# Patient Record
Sex: Male | Born: 1960 | Race: White | Hispanic: No | Marital: Single | Smoking: Current every day smoker
Health system: Southern US, Community
[De-identification: ages and names within clinical notes are randomized; demographics above are authoritative.]

## PROBLEM LIST (undated history)

## (undated) DIAGNOSIS — M5126 Other intervertebral disc displacement, lumbar region: Secondary | ICD-10-CM

## (undated) HISTORY — PX: COLONOSCOPY: SHX174

---

## 2015-11-12 ENCOUNTER — Ambulatory Visit (INDEPENDENT_AMBULATORY_CARE_PROVIDER_SITE_OTHER): Payer: BLUE CROSS/BLUE SHIELD | Admitting: Podiatry

## 2015-11-12 ENCOUNTER — Ambulatory Visit (INDEPENDENT_AMBULATORY_CARE_PROVIDER_SITE_OTHER): Payer: BLUE CROSS/BLUE SHIELD

## 2015-11-12 ENCOUNTER — Encounter: Payer: Self-pay | Admitting: Podiatry

## 2015-11-12 VITALS — BP 143/89 | HR 90 | Resp 14

## 2015-11-12 DIAGNOSIS — G5761 Lesion of plantar nerve, right lower limb: Secondary | ICD-10-CM

## 2015-11-12 DIAGNOSIS — R52 Pain, unspecified: Secondary | ICD-10-CM

## 2015-11-12 DIAGNOSIS — G5781 Other specified mononeuropathies of right lower limb: Secondary | ICD-10-CM

## 2015-11-12 NOTE — Progress Notes (Addendum)
   Subjective:    Patient ID: [name], male    DOB: 01/14/1961, 55 y.o.   MRN: [id number]  HPI this patient presents to the office with chief complaint of a painful right foot.  He says this pain has been present for about 4 weeks. He denies any trauma or injury to the foot. He does admit being active and walking on concrete. He also says there is burning noted in the area of the pain of the right foot. He denies any redness or swelling to his right foot. He presents the office for an evaluation and treatment    Review of Systems  All other systems reviewed and are negative.      Objective:   Physical Exam GENERAL APPEARANCE: Alert, conversant. Appropriately groomed. No acute distress.  VASCULAR: Pedal pulses are  palpable at  [facility] and [facility] bilateral.  Capillary refill time is immediate to all digits,  Normal temperature gradient.  Digital hair growth is present bilateral  NEUROLOGIC: sensation is normal to 5.07 monofilament at 5/5 sites bilateral.  Light touch is intact bilateral, Muscle strength normal.  MUSCULOSKELETAL: acceptable muscle strength, tone and stability bilateral.  Intrinsic muscluature intact bilateral.  Rectus appearance of foot and digits noted bilateral. Palpable pain third interspace right foot.  Rigid hammer toe second right foot.  DERMATOLOGIC: skin color, texture, and turgor are within normal limits.  No preulcerative lesions or ulcers  are seen, no interdigital maceration noted.  No open lesions present.  Digital nails are asymptomatic. No drainage noted.         Assessment & Plan:  Neuroma right foot.    IE  Xray reveals no pathology except hammer toe.  Injection therapy right foot.  Powerstep was recommended.  RTC 2 weeks.   Helane GuntherGregory Mayer DPM

## 2015-11-26 ENCOUNTER — Ambulatory Visit (INDEPENDENT_AMBULATORY_CARE_PROVIDER_SITE_OTHER): Payer: BLUE CROSS/BLUE SHIELD | Admitting: Podiatry

## 2015-11-26 ENCOUNTER — Encounter: Payer: Self-pay | Admitting: Podiatry

## 2015-11-26 VITALS — BP 138/88 | HR 85 | Resp 14

## 2015-11-26 DIAGNOSIS — G5761 Lesion of plantar nerve, right lower limb: Secondary | ICD-10-CM | POA: Diagnosis not present

## 2015-11-26 DIAGNOSIS — G5781 Other specified mononeuropathies of right lower limb: Secondary | ICD-10-CM

## 2015-11-26 NOTE — Progress Notes (Signed)
Subjective:     Patient ID: [name], male   DOB: 08/24/1960, 55 y.o.   MRN: [id number]  HPI this patient returns to the office follow-up for diagnosis neuroma third interspace left foot. He was treated with injection therapy and says that he is over 50% improved. He says he no longer feels a hard marble under his right foot. He says there is still pain and discomfort at the site. He presents to the office stating that the pure strides are helping but he he does significant walking on concrete at work. He presents the office for continued evaluation and treatment of this diagnosed neuroma   Review of Systems     Objective:   Physical Exam GENERAL APPEARANCE: Alert, conversant. Appropriately groomed. No acute distress.  VASCULAR: Pedal [facility]  [facility].  Capillary refill time is immediate to all digits,  Normal temperature gradient.  Digital hair growth is present bilateral  NEUROLOGIC: sensation is normal to 5.07 monofilament at 5/5 sites bilateral.  Light touch is intact bilateral, Muscle strength normal.  MUSCULOSKELETAL: acceptable muscle strength, tone and stability bilateral.  Intrinsic muscluature intact bilateral.  Rectus appearance of foot and digits noted bilateral. Palpable pain third interspace right foot.  Rigid hammer toe right foot.  DERMATOLOGIC: skin color, texture, and turgor are within normal limits.  No preulcerative lesions or ulcers  are seen, no interdigital maceration noted.  No open lesions present.  Digital nails are asymptomatic. No drainage noted.      Assessment:     Neuroma third interspace right foot.     Plan:     ROV  Injection therapy right foot.  Discussed next step in treatment of his neuroma. RTC prn   Helane GuntherGregory Virgel Haro DPM

## 2016-09-12 ENCOUNTER — Ambulatory Visit: Payer: Self-pay | Admitting: Orthopedic Surgery

## 2016-09-12 NOTE — Progress Notes (Signed)
Please place orders in EPIC as patient is being scheduled for a Pre-op appointment! Thank you! 

## 2016-09-12 NOTE — H&P (Signed)
[name] is an 55 y.o. male.   Chief Complaint: Left leg pain, numbness and weakness HPI: The patient is a 55 year old male who presents with back pain. The patient is here today for an IME with take over of care. The patient reports low back symptoms which began 12 week(s) ago following a specific injury. The injury occurred 06/04/2016 at work due to lifting (40-60 lbs) and bending. Symptoms are reported to be located in the low back and Symptoms include pain. The pain radiates to the left buttock, left posterior thigh, left lower leg and left foot. Symptoms are exacerbated by standing (for extended periods of time) and sitting. Current treatment includes nonsteroidal anti-inflammatory drugs. Prior to being seen today the patient was previously evaluated in the ER and at [facility]. Past evaluation has included x-ray of the lumbar spine and MRI of the lumbar spine. Past treatment has included nonsteroidal anti-inflammatory drugs, non-opioid analgesics, muscle relaxants and corticosteroids. The patient states that this is a Worker's Compensation case. Note for "Back pain": The patient is currently working with light duty restrictions.  HISTORY This is a very pleasant gentleman who presents here for an independent medical exam for a work related injury. The patient reports that on 06/04/2016 while he was working and doing well he was lifting an object maybe 40 pounds bending and twisting at that time. He had acute low back pain and that progressed into the left lower extremity radicular pain. Back pain that night and then the next morning got out of bed he had severe pain into the leg. This prompted him to present to the emergency room. This was at [facility] and that was in [place], [place]. He was found and diagnosed with acute low back pain and left lower extremity pain. There was seen by Dr. Vreeland.  He was will placed on ibuprofen, prednisone and cyclobenzaprine. He felty had  some mild help from that. He was subsequently seen at [facility] on 07/14/2016. Prior to that he was seen by Dr. James Kramer, I do not have that medical record. He apparently ordered an MRI. This was performed on 06/30/2016. This was a [facility], this where apparently was noted to have a large disc herniation at 3-4 compressing the 4 root with associated severe stenosis at 3-4 through 4-5. He was at that time taking tramadol, gabapentin. He reported a history of a neuropathy in the right lower extremity, not in the left lower extremity. On December 18, he was subsequently seen by a Dr. Lemons and had a consultation. He was found to have severe stenosis, disc herniation on the left. He was left groin and thigh pain as well. Trouble walking. He discussed a decompression from L2 to S1. Weakness and ankle dorsiflexion on the left compared to the right. He was also counseled on tobacco cessation. Started on methocarbamol, tramadol. X-rays indicated no evidence of a spondylolisthesis.  REVIEW OF SYSTEMS Negative for fevers, chest pain, shortness of breath, unexplained recent weight loss, loss of bowel or bladder function, burning with urination, joint swelling, rashes, weakness or numbness, difficulty with balance, easy bruising, excessive thirst or frequent urination. He presents here for a second opinion concerning his current situation. He is taking the gabapentin one to two twice a day.  In terms of his neuropathy on the right he has not a diabetic. There is no specific diagnosis for the neuropathy, but was taken gabapentin and it was helping him.  In terms of   the left leg it is worse when he stands or sits for long periods of time. When he bends or lifts.  He has not had a problem with neck and upper extremity pain, problems with his bladder, ataxia, fevers or chills.  The patient pain drawing is organic. He did not rate his pain. Visual analog scale indicated mid range at this  point. He reports left greater than right pain.  He also reports continuing to work. He has requested perhaps out of work. He lives in [place], [place], perhaps closer to [place].  Past Medical History Bleeding disorder  Hepatitis C  Hypercholesterolemia  Peripheral Neuropathy   Allergies No Known Drug Allergies [08/27/2016]:  Family History Heart Disease  Maternal Grandfather.  Social History  Children  1 Current drinker  08/27/2016: Currently drinks beer and hard liquor 5-7 times per week Current work status  working full time Exercise  Exercises daily; does other Living situation  live alone Marital status  single No history of drug/alcohol rehab  Not under pain contract  Number of flights of stairs before winded  greater than 5 Tobacco / smoke exposure  08/27/2016: yes outdoors only Tobacco use  Current every day smoker. 08/27/2016: smoke(d) 1/2 pack(s) per day  Medication History Atorvastatin Calcium (20MG Tablet, Oral) Active.  Past Surgical History Arthroscopy of Knee  bilateral Leg Circulation Surgery  right Tonsillectomy   Review of Systems  Constitutional: Negative.   HENT: Negative.   Eyes: Negative.   Respiratory: Negative.   Cardiovascular: Negative.   Gastrointestinal: Negative.   Genitourinary: Negative.   Musculoskeletal: Positive for back pain.  Skin: Negative.   Neurological: Positive for sensory change and focal weakness.  Endo/Heme/Allergies: Negative.   Psychiatric/Behavioral: Negative.    Vitals 08/27/2016 3:48 PM Weight: 185 lb Height: 71in Body Surface Area: 2.04 m Body Mass Index: 25.8 kg/m  BP: 123/77 (Sitting, Right Arm, Standard)  Physical Exam  Constitutional: He is oriented to person, place, and time. He appears well-developed and well-nourished.  HENT:  Head: Normocephalic.  Eyes: Pupils are equal, round, and reactive to light.  Neck: Normal range of motion.  Cardiovascular: Normal rate.    Respiratory: Effort normal.  GI: Soft.  Musculoskeletal:  On exam, he is a healthy male in mild to moderate distress, mood and affect, probably with sliding antalgic gait. He has tender lumbosacral junction, left PSIS. He has pain with extension and flexion of lumbar spine both limited. Straight leg raises buttock, thigh and calf pain on the left, negative on the right. He has bilateral EHL weakness. He does have some 5-/5 dorsiflexion of tibialis anterior on the left, he has 5-/5 quadriceps strength on left compared to the right. He is hyperreflexic, sensory exam in L5 dermatome bilaterally. Lumbar spine exam reveals no evidence of soft tissue swelling, deformity or skin ecchymosis. No flank pain with percussion. The abdomen is soft and nontender. Nontender over the trochanters. No cellulitis or lymphadenopathy.  Good range of motion of the lumbar spine without associated pain. There is no Babinski or clonus. Patient has good distal pulses. No DVT. No pain and normal range of motion without instability of the hips, knees and ankles.  Cervical spine, he has painless range of motion. He has some pain with end forward flexion and extension but no radicular pain. Inspection of the cervical spine reveals a normal lordosis without evidence of paraspinous spasms or soft tissue swelling. Nontender to palpation. Full extension, full left and right lateral rotation. Extension combined with lateral   flexion does not reproduce pain. Negative impingement sign, negative secondary impingement sign of the shoulders. Negative Tinel's median and ulnar nerves at the elbow. Negative carpal compression test at the wrist. Motor of the upper extremities is 5/5 including biceps, triceps, brachioradialis, wrist flexion, wrist extension, finger flexion, finger extension. Reflexes are normoreflexic. Sensory exam is intact to light touch. There is no Hoffmann sign. Nontender over the thoracic spine.  Neurological: He is alert and  oriented to person, place, and time.  Skin: Skin is warm and dry.    Imaging AP, lateral, flexion, extension radiographs of lumbar spine demonstrates a mild lumbar scoliosis, degenerative. He has mild osteoarthrosis. He has moderate osteoarthrosis of the left hip, mild on the right with a Cam impingement deformity. He has cystic changes in the femoral heads bilaterally. Flexion extension radiographs demonstrate multilevel disc degeneration, severe at 5-1 with a vacuum phenomena and neuroforaminal stenosis of L5. He has severe disc degeneration at 4-5 as well. No instability in flexion extension. He has moderate degenerative changes at 3-4 and at 2-3.  MRI December 4 demonstrates severe multifactorial stenosis at 3-4 and at 4-5. The acute disc herniation is noted at 3-4 extending caudad paracentral to the left.  L5-S1, he has facet arthropathy and he has some lateral recess stenosis at 5-1.  Assessment/Plan 1. L4 radiculopathy secondary to acute disc herniation at 3-4. 2. Underlying severe congenital and acquired spinal stenosis at 3-4 and at 4-5. 3. Multilevel disc degeneration, severe at 4-5 and at 5-1, moderate 3-4 and at 2-3 without instability on flexion, extension. 4. History of diagnosed peripheral neuropathy with likely chronic radiculopathy secondary to his spinal stenosis.  PLAN, RECOMMENDATION AND DISCUSSION Extensive discussion concerning his current pathology, relevant anatomy and treatment options. I do feel given his mechanism of injury as described it is sufficient to produce an acute disc herniation, which is noted by his symptomatology as well as his MRI given the disc herniation at 3-4. He does have underlying preexisting spinal stenosis at 3-4 and at 4-5 and is most likely the cause of his diagnosed chronic neuropathy on the right given his severe stenosis noted at 4-5.  I do agree that he will likely require a decompression to decompress at 3-4 nerve root as he has a paucity of  capacity in the spinal canal to accommodate an acute disc herniation of this size and given his minor neurologic deficit I do not feel that epidural steroid injection would be particularly efficacious at this point. I do feel, however, at least an urgent evaluation by a Mckenzie based therapist would be appropriate to determine whether he has any positional based reduction in his radiculopathy or centralization. I do, however, agree that given the mass effect of the disc herniation with his concomitant spinal stenosis it would require surgical decompression to gain access to the disc herniation at 3-4. It will require a decompression most likely at 3-4 removing neural arch of 3 and 4 and decompressing 3-4, 4-5 extending possibly into 2-3. He does have some mild stenosis at 5-1 and may require a hemidecompression L5-S1 on the left. He does have some evidence of an S1 radiculopathy, though it is most likely secondary to compression of the thecal sac at 4-5.  In the interim, we discussed activity modification, strategy to avoid reinjury, he is to avoid extention, favor flexion, I would recommend no prolonged standing or sitting over 30 minutes of time, no repetitive bending. No lifting over 10 pounds.  I do recommend analgesics. Gabapentin   has been particularly beneficial to him in the past and recommend 300 mg titrating up to three tablets three times a day for effect, in the interim an underlying anti-inflammatory medication with activity modification.  We discussed his pathology, relevant anatomy, reviewed his x-rays, MRI, his previous symptoms of a neuropathy and its relationship to his underlying stenosis, the mechanism of injury and his treatment options. At this point, I do agree that tobacco cessation is prerequisite as well indicating deleterious side effects upon healing, epidural fibrosis and infection rates. Typically this would require a decompression overnight in the hospital, sutures out in two  weeks and re-engage in formal supervised physical therapy until six weeks, at which point he could engage in a work conditioning type program and maximum medical improvement three months status post. Again the acute injury is his disc herniation, which is also exacerbate his underlying spinal stenosis and to address the disc herniation it would require decompressing his underlying spinal stenosis. I do not feel that he would require instrumentation. There is no sign of instability or foraminal compression that significant to that degree. He has minor mechanical back pain, he understands.  He does have a component of osteoarthrosis in his hips. He does not have severe or significant groin pain. I have discussed that in terms of future observation, avoid pivoting and further imaging if he develops groin pain or pivoting pain within the left groin.  We discussed this in extensive detail. This is currently an independent medical exam only. If he is so chooses to initiate treatment here we will be happy to assist in any manner in his care. He will discuss this with his adjuster. I would again recommend at least a session or two of Mackenzie based therapy which most likely will require Williams flexion based program, his gabapentin and then a repeat examination to determine its efficacy. He does seem to be somewhat better since his initial presentation in terms of severity of the pain. However, given his ongoing radicular pain and his clinical exam, I favor at this point decompression.  I appreciate the kind referral and ability to evaluate Mr. Luellen.  Plan microlumbar decompression L3-4, L4-5  Anwen Cannedy M., PA-C for Dr. Beane 09/12/2016, 10:08 AM   

## 2016-09-12 NOTE — Patient Instructions (Signed)
Tressia MinersScott Gockley  09/12/2016   Your procedure is scheduled on: 09-18-16  [facility]  [facility] to 3rd floor to  Short Stay Center at 7AM.  Call this number if you have problems the morning of surgery [phone]   Remember: ONLY 1 PERSON MAY GO WITH YOU TO SHORT STAY TO GET  READY MORNING OF YOUR SURGERY.  Do not eat food or drink liquids :After Midnight.     Take these medicines the morning of surgery with A SIP OF WATER: tylenol as needed, gabapentin(Neurontin)                                You may not have any metal on your body including hair pins and              piercings  Do not wear jewelry, make-up, lotions, powders or perfumes, deodorant             Do not wear nail polish.  Do not shave  48 hours prior to surgery.              Men may shave face and neck.   Do not bring valuables to the hospital.  IS NOT             RESPONSIBLE   FOR VALUABLES.  Contacts, dentures or bridgework may not be worn into surgery.  Leave suitcase in the car. After surgery it may be brought to your room.              Please read over the following fact sheets you were given: _____________________________________________________________________             [facility] Pre[facility] Before surgery, you can play an important role.  Because skin is not sterile, your skin needs to be as free of germs as possible.  You can reduce the number of germs on your skin by washing with CHG (chlorahexidine gluconate) soap before surgery.  CHG is an antiseptic cleaner which kills germs and bonds with the skin to continue killing germs even after washing. Please DO NOT use if you have an allergy to CHG or antibacterial soaps.  If your skin becomes reddened/irritated stop using the CHG and inform your nurse when you arrive at Short Stay. Do not shave (including legs and underarms) for at least 48 hours prior to the first CHG shower.   You may shave your face/neck. Please follow these instructions carefully:  1.  Shower with CHG Soap the night before surgery and the  morning of Surgery.  2.  If you choose to wash your hair, wash your hair first as usual with your  normal  shampoo.  3.  After you shampoo, rinse your hair and body thoroughly to remove the  shampoo.                           4.  Use CHG as you would any other liquid soap.  You can apply chg directly  to the skin and wash                       Gently with a scrungie or clean washcloth.  5.  Apply the CHG Soap to your body  ONLY FROM THE NECK DOWN.   Do not use on face/ open                           Wound or open sores. Avoid contact with eyes, ears mouth and genitals (private parts).                       Wash face,  Genitals (private parts) with your normal soap.             6.  Wash thoroughly, paying special attention to the area where your surgery  will be performed.  7.  Thoroughly rinse your body with warm water from the neck down.  8.  DO NOT shower/wash with your normal soap after using and rinsing off  the CHG Soap.                9.  Pat yourself dry with a clean towel.            10.  Wear clean pajamas.            11.  Place clean sheets on your bed the night of your first shower and do not  sleep with pets. Day of Surgery : Do not apply any lotions/deodorants the morning of surgery.  Please wear clean clothes to the hospital/surgery center.  FAILURE TO FOLLOW THESE INSTRUCTIONS MAY RESULT IN THE CANCELLATION OF YOUR SURGERY PATIENT SIGNATURE_________________________________  NURSE SIGNATURE__________________________________  ________________________________________________________________________   Adam Phenix  An incentive spirometer is a tool that can help keep your lungs clear and active. This tool measures how well you are filling your lungs with each breath. Taking long deep breaths may help reverse or decrease the chance of  developing breathing (pulmonary) problems (especially infection) following:  A long period of time when you are unable to move or be active. BEFORE THE PROCEDURE   If the spirometer includes an indicator to show your best effort, your nurse or respiratory therapist will set it to a desired goal.  If possible, sit up straight or lean slightly forward. Try not to slouch.  Hold the incentive spirometer in an upright position. INSTRUCTIONS FOR USE  1. Sit on the edge of your bed if possible, or sit up as far as you can in bed or on a chair. 2. Hold the incentive spirometer in an upright position. 3. Breathe out normally. 4. Place the mouthpiece in your mouth and seal your lips tightly around it. 5. Breathe in slowly and as deeply as possible, raising the piston or the ball toward the top of the column. 6. Hold your breath for 3-5 seconds or for as long as possible. Allow the piston or ball to fall to the bottom of the column. 7. Remove the mouthpiece from your mouth and breathe out normally. 8. Rest for a few seconds and repeat Steps 1 through 7 at least 10 times every 1-2 hours when you are awake. Take your time and take a few normal breaths between deep breaths. 9. The spirometer may include an indicator to show your best effort. Use the indicator as a goal to work toward during each repetition. 10. After each set of 10 deep breaths, practice coughing to be sure your lungs are clear. If you have an incision (the cut made at the time of surgery), support your incision when coughing by placing a pillow or rolled up towels firmly against it. Once  you are able to get out of bed, walk around indoors and cough well. You may stop using the incentive spirometer when instructed by your caregiver.  RISKS AND COMPLICATIONS  Take your time so you do not get dizzy or light-headed.  If you are in pain, you may need to take or ask for pain medication before doing incentive spirometry. It is harder to take a  deep breath if you are having pain. AFTER USE  Rest and breathe slowly and easily.  It can be helpful to keep track of a log of your progress. Your caregiver can provide you with a simple table to help with this. If you are using the spirometer at home, follow these instructions: [place] IF:   You are having difficultly using the spirometer.  You have trouble using the spirometer as often as instructed.  Your pain medication is not giving enough relief while using the spirometer.  You develop fever of 100.5 F (38.1 C) or higher. SEEK IMMEDIATE MEDICAL CARE IF:   You cough up bloody sputum that had not been present before.  You develop fever of 102 F (38.9 C) or greater.  You develop worsening pain at or near the incision site. MAKE SURE YOU:   Understand these instructions.  Will watch your condition.  Will get help right away if you are not doing well or get worse. Document Released: 11/24/2006 Document Revised: 10/06/2011 Document Reviewed: 01/25/2007 [facility] Patient Information 2014 [place], [place].   ________________________________________________________________________

## 2016-09-15 ENCOUNTER — Encounter (HOSPITAL_COMMUNITY)
Admission: RE | Admit: 2016-09-15 | Discharge: 2016-09-15 | Disposition: A | Discharge status: Home / Self Care | Payer: Worker's Compensation | Source: Ambulatory Visit | Attending: Specialist | Admitting: Specialist

## 2016-09-15 ENCOUNTER — Ambulatory Visit (HOSPITAL_COMMUNITY)
Admission: RE | Admit: 2016-09-15 | Discharge: 2016-09-15 | Disposition: A | Discharge status: Home / Self Care | Payer: Worker's Compensation | Source: Ambulatory Visit | Attending: Orthopedic Surgery | Admitting: Orthopedic Surgery

## 2016-09-15 ENCOUNTER — Encounter (HOSPITAL_COMMUNITY): Payer: Self-pay

## 2016-09-15 ENCOUNTER — Encounter (INDEPENDENT_AMBULATORY_CARE_PROVIDER_SITE_OTHER): Payer: Self-pay

## 2016-09-15 DIAGNOSIS — M419 Scoliosis, unspecified: Secondary | ICD-10-CM | POA: Insufficient documentation

## 2016-09-15 DIAGNOSIS — Z01818 Encounter for other preprocedural examination: Secondary | ICD-10-CM | POA: Diagnosis not present

## 2016-09-15 DIAGNOSIS — M5126 Other intervertebral disc displacement, lumbar region: Secondary | ICD-10-CM | POA: Diagnosis not present

## 2016-09-15 HISTORY — DX: Other intervertebral disc displacement, lumbar region: M51.26

## 2016-09-15 LAB — COMPREHENSIVE METABOLIC PANEL
ALBUMIN: 4.6 g/dL (ref 3.5–5.0)
ALK PHOS: 77 U/L (ref 38–126)
ALT: 34 U/L (ref 17–63)
ANION GAP: 10 (ref 5–15)
AST: 40 U/L (ref 15–41)
BUN: 14 mg/dL (ref 6–20)
CALCIUM: 9.5 mg/dL (ref 8.9–10.3)
CO2: 23 mmol/L (ref 22–32)
Chloride: 105 mmol/L (ref 101–111)
Creatinine, Ser: 0.81 mg/dL (ref 0.61–1.24)
GFR calc Af Amer: >60 mL/min (ref >60)
GFR calc non Af Amer: >60 mL/min (ref >60)
GLUCOSE: 88 mg/dL (ref 65–99)
Potassium: 4.6 mmol/L (ref 3.5–5.1)
Sodium: 138 mmol/L (ref 135–145)
Total Bilirubin: 0.3 mg/dL (ref 0.3–1.2)
Total Protein: 8.1 g/dL (ref 6.5–8.1)

## 2016-09-15 LAB — CBC
HCT: 45 % (ref 39.0–52.0)
Hemoglobin: 15.8 g/dL (ref 13.0–17.0)
MCH: 35.6 pg — ABNORMAL HIGH (ref 26.0–34.0)
MCHC: 35.1 g/dL (ref 30.0–36.0)
MCV: 101.4 fL — AB (ref 78.0–100.0)
PLATELETS: 155 10*3/uL (ref 150–400)
RBC: 4.44 MIL/uL (ref 4.22–5.81)
RDW: 13.1 % (ref 11.5–15.5)
WBC: 6 10*3/uL (ref 4.0–10.5)

## 2016-09-15 LAB — SURGICAL PCR SCREEN
MRSA, PCR: NEGATIVE
Staphylococcus aureus: NEGATIVE

## 2016-09-15 LAB — PROTIME-INR
INR: 0.86
Prothrombin Time: 11.7 seconds (ref 11.4–15.2)

## 2016-09-18 NOTE — Progress Notes (Signed)
Patient called with new date, time and arrival.  Patient already aware.  Patient still has same preop instructions with hibiclens and will follow.

## 2016-10-02 ENCOUNTER — Encounter (HOSPITAL_COMMUNITY)
Admission: RE | Disposition: A | Discharge status: Home / Self Care | Payer: Self-pay | Source: Ambulatory Visit | Attending: Specialist

## 2016-10-02 ENCOUNTER — Ambulatory Visit (HOSPITAL_COMMUNITY): Payer: Worker's Compensation | Admitting: Anesthesiology

## 2016-10-02 ENCOUNTER — Encounter (HOSPITAL_COMMUNITY): Payer: Self-pay

## 2016-10-02 ENCOUNTER — Ambulatory Visit (HOSPITAL_COMMUNITY): Payer: Worker's Compensation

## 2016-10-02 ENCOUNTER — Ambulatory Visit (HOSPITAL_COMMUNITY)
Admission: RE | Admit: 2016-10-02 | Discharge: 2016-10-03 | Disposition: A | Discharge status: Home / Self Care | Payer: Worker's Compensation | Source: Ambulatory Visit | Attending: Specialist | Admitting: Specialist

## 2016-10-02 DIAGNOSIS — Z8249 Family history of ischemic heart disease and other diseases of the circulatory system: Secondary | ICD-10-CM | POA: Insufficient documentation

## 2016-10-02 DIAGNOSIS — Z79899 Other long term (current) drug therapy: Secondary | ICD-10-CM | POA: Diagnosis not present

## 2016-10-02 DIAGNOSIS — Z8619 Personal history of other infectious and parasitic diseases: Secondary | ICD-10-CM | POA: Diagnosis not present

## 2016-10-02 DIAGNOSIS — M419 Scoliosis, unspecified: Secondary | ICD-10-CM | POA: Insufficient documentation

## 2016-10-02 DIAGNOSIS — M160 Bilateral primary osteoarthritis of hip: Secondary | ICD-10-CM | POA: Diagnosis not present

## 2016-10-02 DIAGNOSIS — Z419 Encounter for procedure for purposes other than remedying health state, unspecified: Secondary | ICD-10-CM

## 2016-10-02 DIAGNOSIS — F17200 Nicotine dependence, unspecified, uncomplicated: Secondary | ICD-10-CM | POA: Diagnosis not present

## 2016-10-02 DIAGNOSIS — G629 Polyneuropathy, unspecified: Secondary | ICD-10-CM | POA: Insufficient documentation

## 2016-10-02 DIAGNOSIS — M48061 Spinal stenosis, lumbar region without neurogenic claudication: Secondary | ICD-10-CM | POA: Diagnosis present

## 2016-10-02 DIAGNOSIS — E7800 Pure hypercholesterolemia, unspecified: Secondary | ICD-10-CM | POA: Diagnosis not present

## 2016-10-02 DIAGNOSIS — M5116 Intervertebral disc disorders with radiculopathy, lumbar region: Secondary | ICD-10-CM | POA: Insufficient documentation

## 2016-10-02 HISTORY — PX: LUMBAR LAMINECTOMY/DECOMPRESSION MICRODISCECTOMY: SHX5026

## 2016-10-02 SURGERY — LUMBAR LAMINECTOMY/DECOMPRESSION MICRODISCECTOMY 2 LEVELS
Anesthesia: General | Site: Back | Laterality: Bilateral

## 2016-10-02 MED ORDER — LACTATED RINGERS IV SOLN
INTRAVENOUS | Status: DC
  Administered 2016-10-02: 1000 mL via INTRAVENOUS
  Administered 2016-10-02 (×2): via INTRAVENOUS

## 2016-10-02 MED ORDER — MEPERIDINE HCL 50 MG/ML IJ SOLN: 6.2500 mg | INTRAMUSCULAR | Status: DC | PRN

## 2016-10-02 MED ORDER — PHENOL 1.4 % MT LIQD
1.0000 | OROMUCOSAL | Status: DC | PRN
  Filled 2016-10-02: qty 177

## 2016-10-02 MED ORDER — ROCURONIUM BROMIDE 50 MG/5ML IV SOSY
PREFILLED_SYRINGE | INTRAVENOUS | Status: AC
  Filled 2016-10-02: qty 5

## 2016-10-02 MED ORDER — POLYETHYLENE GLYCOL 3350 17 G PO PACK: 17.0000 g | PACK | Freq: Every day | ORAL | 0 refills | Status: AC

## 2016-10-02 MED ORDER — MAGNESIUM CITRATE PO SOLN: 1.0000 | Freq: Once | ORAL | Status: DC | PRN

## 2016-10-02 MED ORDER — LIDOCAINE 2% (20 MG/ML) 5 ML SYRINGE
INTRAMUSCULAR | Status: AC
  Filled 2016-10-02: qty 5

## 2016-10-02 MED ORDER — DOCUSATE SODIUM 100 MG PO CAPS: 100.0000 mg | ORAL_CAPSULE | Freq: Two times a day (BID) | ORAL | 1 refills | Status: AC | PRN

## 2016-10-02 MED ORDER — SODIUM CHLORIDE 0.9 % IJ SOLN
INTRAMUSCULAR | Status: AC
  Filled 2016-10-02: qty 10

## 2016-10-02 MED ORDER — OXYCODONE-ACETAMINOPHEN 5-325 MG PO TABS: 1.0000 | ORAL_TABLET | ORAL | 0 refills | Status: AC | PRN

## 2016-10-02 MED ORDER — LABETALOL HCL 5 MG/ML IV SOLN
INTRAVENOUS | Status: AC
  Filled 2016-10-02: qty 4

## 2016-10-02 MED ORDER — CEFAZOLIN SODIUM-DEXTROSE 2-4 GM/100ML-% IV SOLN
2.0000 g | Freq: Three times a day (TID) | INTRAVENOUS | Status: DC
  Administered 2016-10-02 – 2016-10-03 (×2): 2 g via INTRAVENOUS
  Filled 2016-10-02 (×3): qty 100

## 2016-10-02 MED ORDER — IBUPROFEN 200 MG PO TABS: 600.0000 mg | ORAL_TABLET | Freq: Three times a day (TID) | ORAL | 0 refills | Status: AC | PRN

## 2016-10-02 MED ORDER — OXYCODONE HCL 5 MG PO TABS
5.0000 mg | ORAL_TABLET | ORAL | Status: DC | PRN
  Administered 2016-10-02: 10 mg via ORAL
  Administered 2016-10-02 (×2): 5 mg via ORAL
  Administered 2016-10-03 (×4): 10 mg via ORAL
  Filled 2016-10-02 (×2): qty 2
  Filled 2016-10-02: qty 1
  Filled 2016-10-02: qty 2
  Filled 2016-10-02: qty 1
  Filled 2016-10-02 (×2): qty 2

## 2016-10-02 MED ORDER — GABAPENTIN 300 MG PO CAPS
300.0000 mg | ORAL_CAPSULE | Freq: Three times a day (TID) | ORAL | Status: DC
  Administered 2016-10-02 – 2016-10-03 (×3): 300 mg via ORAL
  Filled 2016-10-02 (×3): qty 1

## 2016-10-02 MED ORDER — SODIUM CHLORIDE 0.9 % IR SOLN
Status: DC | PRN
  Administered 2016-10-02: 500 mL

## 2016-10-02 MED ORDER — ONDANSETRON HCL 4 MG/2ML IJ SOLN
INTRAMUSCULAR | Status: DC | PRN
  Administered 2016-10-02: 4 mg via INTRAVENOUS

## 2016-10-02 MED ORDER — SUFENTANIL CITRATE 50 MCG/ML IV SOLN
INTRAVENOUS | Status: AC
  Filled 2016-10-02: qty 1

## 2016-10-02 MED ORDER — METHOCARBAMOL 1000 MG/10ML IJ SOLN
500.0000 mg | Freq: Four times a day (QID) | INTRAVENOUS | Status: DC | PRN
  Administered 2016-10-02: 500 mg via INTRAVENOUS
  Filled 2016-10-02: qty 5
  Filled 2016-10-02: qty 550

## 2016-10-02 MED ORDER — THROMBIN 5000 UNITS EX SOLR
OROMUCOSAL | Status: DC | PRN
  Administered 2016-10-02: 10 mL via TOPICAL

## 2016-10-02 MED ORDER — HYDROMORPHONE HCL 1 MG/ML IJ SOLN
INTRAMUSCULAR | Status: DC | PRN
  Administered 2016-10-02 (×4): .4 mg via INTRAVENOUS

## 2016-10-02 MED ORDER — ACETAMINOPHEN 325 MG PO TABS: 650.0000 mg | ORAL_TABLET | ORAL | Status: DC | PRN

## 2016-10-02 MED ORDER — SUFENTANIL CITRATE 50 MCG/ML IV SOLN
INTRAVENOUS | Status: DC | PRN
  Administered 2016-10-02: 10 ug via INTRAVENOUS
  Administered 2016-10-02: 25 ug via INTRAVENOUS
  Administered 2016-10-02: 10 ug via INTRAVENOUS
  Administered 2016-10-02: 5 ug via INTRAVENOUS

## 2016-10-02 MED ORDER — PHENYLEPHRINE 40 MCG/ML (10ML) SYRINGE FOR IV PUSH (FOR BLOOD PRESSURE SUPPORT)
PREFILLED_SYRINGE | INTRAVENOUS | Status: AC
  Filled 2016-10-02: qty 10

## 2016-10-02 MED ORDER — HYDROMORPHONE HCL 1 MG/ML IJ SOLN: 1.0000 mg | INTRAMUSCULAR | Status: DC | PRN

## 2016-10-02 MED ORDER — BISACODYL 5 MG PO TBEC: 5.0000 mg | DELAYED_RELEASE_TABLET | Freq: Every day | ORAL | Status: DC | PRN

## 2016-10-02 MED ORDER — ACETAMINOPHEN 10 MG/ML IV SOLN
INTRAVENOUS | Status: AC
  Filled 2016-10-02: qty 100

## 2016-10-02 MED ORDER — LIDOCAINE 2% (20 MG/ML) 5 ML SYRINGE
INTRAMUSCULAR | Status: DC | PRN
  Administered 2016-10-02: 100 mg via INTRAVENOUS

## 2016-10-02 MED ORDER — ALUM & MAG HYDROXIDE-SIMETH 200-200-20 MG/5ML PO SUSP: 30.0000 mL | Freq: Four times a day (QID) | ORAL | Status: DC | PRN

## 2016-10-02 MED ORDER — ONDANSETRON HCL 4 MG/2ML IJ SOLN: 4.0000 mg | Freq: Four times a day (QID) | INTRAMUSCULAR | Status: DC | PRN

## 2016-10-02 MED ORDER — CEFAZOLIN SODIUM-DEXTROSE 2-4 GM/100ML-% IV SOLN
2.0000 g | INTRAVENOUS | Status: AC
  Administered 2016-10-02: 2 g via INTRAVENOUS
  Filled 2016-10-02: qty 100

## 2016-10-02 MED ORDER — PHENYLEPHRINE HCL 10 MG/ML IJ SOLN
INTRAMUSCULAR | Status: DC | PRN
  Administered 2016-10-02 (×2): 40 ug via INTRAVENOUS

## 2016-10-02 MED ORDER — SODIUM CHLORIDE 0.9 % IR SOLN
Status: AC
  Filled 2016-10-02: qty 500000

## 2016-10-02 MED ORDER — BUPIVACAINE-EPINEPHRINE (PF) 0.5% -1:200000 IJ SOLN
INTRAMUSCULAR | Status: AC
  Filled 2016-10-02: qty 30

## 2016-10-02 MED ORDER — SUCCINYLCHOLINE CHLORIDE 200 MG/10ML IV SOSY
PREFILLED_SYRINGE | INTRAVENOUS | Status: AC
  Filled 2016-10-02: qty 10

## 2016-10-02 MED ORDER — SUGAMMADEX SODIUM 200 MG/2ML IV SOLN
INTRAVENOUS | Status: AC
  Filled 2016-10-02: qty 2

## 2016-10-02 MED ORDER — DEXAMETHASONE SODIUM PHOSPHATE 10 MG/ML IJ SOLN
INTRAMUSCULAR | Status: AC
  Filled 2016-10-02: qty 1

## 2016-10-02 MED ORDER — HYDROMORPHONE HCL 1 MG/ML IJ SOLN
INTRAMUSCULAR | Status: AC
  Filled 2016-10-02: qty 1

## 2016-10-02 MED ORDER — PROPOFOL 10 MG/ML IV BOLUS
INTRAVENOUS | Status: AC
  Filled 2016-10-02: qty 20

## 2016-10-02 MED ORDER — DEXAMETHASONE SODIUM PHOSPHATE 10 MG/ML IJ SOLN
INTRAMUSCULAR | Status: DC | PRN
  Administered 2016-10-02: 10 mg via INTRAVENOUS

## 2016-10-02 MED ORDER — RISAQUAD PO CAPS
1.0000 | ORAL_CAPSULE | Freq: Every day | ORAL | Status: DC
  Administered 2016-10-03: 09:00:00 1 via ORAL
  Filled 2016-10-02: qty 1

## 2016-10-02 MED ORDER — ONDANSETRON HCL 4 MG/2ML IJ SOLN
INTRAMUSCULAR | Status: AC
  Filled 2016-10-02: qty 2

## 2016-10-02 MED ORDER — SUGAMMADEX SODIUM 200 MG/2ML IV SOLN
INTRAVENOUS | Status: DC | PRN
  Administered 2016-10-02: 200 mg via INTRAVENOUS

## 2016-10-02 MED ORDER — KCL IN DEXTROSE-NACL 20-5-0.45 MEQ/L-%-% IV SOLN
INTRAVENOUS | Status: DC
  Administered 2016-10-02: 17:00:00 via INTRAVENOUS
  Filled 2016-10-02: qty 1000

## 2016-10-02 MED ORDER — MIDAZOLAM HCL 2 MG/2ML IJ SOLN
INTRAMUSCULAR | Status: DC | PRN
  Administered 2016-10-02: 2 mg via INTRAVENOUS

## 2016-10-02 MED ORDER — ACETAMINOPHEN 650 MG RE SUPP: 650.0000 mg | RECTAL | Status: DC | PRN

## 2016-10-02 MED ORDER — LABETALOL HCL 5 MG/ML IV SOLN
INTRAVENOUS | Status: DC | PRN
  Administered 2016-10-02: 2.5 mg via INTRAVENOUS

## 2016-10-02 MED ORDER — MENTHOL 3 MG MT LOZG: 1.0000 | LOZENGE | OROMUCOSAL | Status: DC | PRN

## 2016-10-02 MED ORDER — POLYETHYLENE GLYCOL 3350 17 G PO PACK: 17.0000 g | PACK | Freq: Every day | ORAL | Status: DC | PRN

## 2016-10-02 MED ORDER — METHOCARBAMOL 500 MG PO TABS
500.0000 mg | ORAL_TABLET | Freq: Four times a day (QID) | ORAL | Status: DC | PRN
  Administered 2016-10-02 – 2016-10-03 (×2): 500 mg via ORAL
  Filled 2016-10-02 (×2): qty 1

## 2016-10-02 MED ORDER — HYDROMORPHONE HCL 2 MG/ML IJ SOLN
INTRAMUSCULAR | Status: AC
  Filled 2016-10-02: qty 1

## 2016-10-02 MED ORDER — DOCUSATE SODIUM 100 MG PO CAPS
100.0000 mg | ORAL_CAPSULE | Freq: Two times a day (BID) | ORAL | Status: DC
  Administered 2016-10-02 – 2016-10-03 (×2): 100 mg via ORAL
  Filled 2016-10-02 (×2): qty 1

## 2016-10-02 MED ORDER — ACETAMINOPHEN 10 MG/ML IV SOLN
1000.0000 mg | Freq: Once | INTRAVENOUS | Status: AC
  Administered 2016-10-02: 1000 mg via INTRAVENOUS

## 2016-10-02 MED ORDER — MIDAZOLAM HCL 2 MG/2ML IJ SOLN
INTRAMUSCULAR | Status: AC
  Filled 2016-10-02: qty 2

## 2016-10-02 MED ORDER — METHOCARBAMOL 500 MG PO TABS: 500.0000 mg | ORAL_TABLET | Freq: Four times a day (QID) | ORAL | 1 refills | Status: AC | PRN

## 2016-10-02 MED ORDER — THROMBIN 5000 UNITS EX SOLR
CUTANEOUS | Status: AC
  Filled 2016-10-02: qty 10000

## 2016-10-02 MED ORDER — BUPIVACAINE-EPINEPHRINE (PF) 0.5% -1:200000 IJ SOLN
INTRAMUSCULAR | Status: DC | PRN
  Administered 2016-10-02: 15 mL

## 2016-10-02 MED ORDER — HYDROMORPHONE HCL 1 MG/ML IJ SOLN
0.2500 mg | INTRAMUSCULAR | Status: DC | PRN
  Administered 2016-10-02: 0.5 mg via INTRAVENOUS

## 2016-10-02 MED ORDER — PROMETHAZINE HCL 25 MG/ML IJ SOLN: 6.2500 mg | INTRAMUSCULAR | Status: DC | PRN

## 2016-10-02 MED ORDER — ROCURONIUM BROMIDE 10 MG/ML (PF) SYRINGE
PREFILLED_SYRINGE | INTRAVENOUS | Status: DC | PRN
  Administered 2016-10-02 (×4): 10 mg via INTRAVENOUS
  Administered 2016-10-02: 40 mg via INTRAVENOUS

## 2016-10-02 MED ORDER — ONDANSETRON HCL 4 MG PO TABS: 4.0000 mg | ORAL_TABLET | Freq: Four times a day (QID) | ORAL | Status: DC | PRN

## 2016-10-02 MED ORDER — PROPOFOL 10 MG/ML IV BOLUS
INTRAVENOUS | Status: DC | PRN
  Administered 2016-10-02: 160 mg via INTRAVENOUS

## 2016-10-02 SURGICAL SUPPLY — 56 items
BAG ZIPLOCK 12X15 (MISCELLANEOUS) ×3 IMPLANT
CLEANER TIP ELECTROSURG 2X2 (MISCELLANEOUS) ×3 IMPLANT
CLOSURE WOUND 1/2 X4 (GAUZE/BANDAGES/DRESSINGS) ×1
CLOTH 2% CHLOROHEXIDINE 3PK (PERSONAL CARE ITEMS) ×3 IMPLANT
DRAPE MICROSCOPE LEICA (MISCELLANEOUS) ×3 IMPLANT
DRAPE SHEET LG 3/4 BI-LAMINATE (DRAPES) ×3 IMPLANT
DRAPE SURG 17X11 SM STRL (DRAPES) ×3 IMPLANT
DRAPE UTILITY XL STRL (DRAPES) ×3 IMPLANT
DRSG AQUACEL AG ADV 3.5X 4 (GAUZE/BANDAGES/DRESSINGS) IMPLANT
DRSG AQUACEL AG ADV 3.5X 6 (GAUZE/BANDAGES/DRESSINGS) IMPLANT
DURAPREP 26ML APPLICATOR (WOUND CARE) ×3 IMPLANT
DURASEAL SPINE SEALANT 3ML (MISCELLANEOUS) IMPLANT
ELECT BLADE TIP CTD 4 INCH (ELECTRODE) IMPLANT
ELECT REM PT RETURN 9FT ADLT (ELECTROSURGICAL) ×3
ELECTRODE REM PT RTRN 9FT ADLT (ELECTROSURGICAL) ×1 IMPLANT
GAUZE SPONGE 4X4 12PLY STRL (GAUZE/BANDAGES/DRESSINGS) ×3 IMPLANT
GLOVE BIOGEL PI IND STRL 7.0 (GLOVE) ×1 IMPLANT
GLOVE BIOGEL PI IND STRL 7.5 (GLOVE) ×4 IMPLANT
GLOVE BIOGEL PI INDICATOR 7.0 (GLOVE) ×2
GLOVE BIOGEL PI INDICATOR 7.5 (GLOVE) ×8
GLOVE SURG SS PI 7.0 STRL IVOR (GLOVE) ×3 IMPLANT
GLOVE SURG SS PI 7.5 STRL IVOR (GLOVE) ×3 IMPLANT
GLOVE SURG SS PI 8.0 STRL IVOR (GLOVE) ×6 IMPLANT
GOWN STRL REUS W/TWL XL LVL3 (GOWN DISPOSABLE) ×9 IMPLANT
HEMOSTAT SPONGE AVITENE ULTRA (HEMOSTASIS) IMPLANT
IV CATH 14GX2 1/4 (CATHETERS) IMPLANT
KIT BASIN OR (CUSTOM PROCEDURE TRAY) ×3 IMPLANT
KIT POSITIONING SURG ANDREWS (MISCELLANEOUS) ×3 IMPLANT
MANIFOLD NEPTUNE II (INSTRUMENTS) ×3 IMPLANT
MARKER SKIN DUAL TIP RULER LAB (MISCELLANEOUS) ×3 IMPLANT
NEEDLE SPNL 18GX3.5 QUINCKE PK (NEEDLE) ×12 IMPLANT
PACK LAMINECTOMY ORTHO (CUSTOM PROCEDURE TRAY) ×3 IMPLANT
PAD ABD 8X10 STRL (GAUZE/BANDAGES/DRESSINGS) ×3 IMPLANT
PAD TELFA 2X3 NADH STRL (GAUZE/BANDAGES/DRESSINGS) ×3 IMPLANT
PATTIES SURGICAL .5 X.5 (GAUZE/BANDAGES/DRESSINGS) ×3 IMPLANT
PATTIES SURGICAL .75X.75 (GAUZE/BANDAGES/DRESSINGS) ×3 IMPLANT
PATTIES SURGICAL 1X1 (DISPOSABLE) IMPLANT
RUBBERBAND STERILE (MISCELLANEOUS) ×3 IMPLANT
SPONGE LAP 4X18 X RAY DECT (DISPOSABLE) ×3 IMPLANT
SPONGE SURGIFOAM ABS GEL 100 (HEMOSTASIS) ×3 IMPLANT
STAPLER VISISTAT (STAPLE) ×3 IMPLANT
STRIP CLOSURE SKIN 1/2X4 (GAUZE/BANDAGES/DRESSINGS) ×2 IMPLANT
SUT NURALON 4 0 TR CR/8 (SUTURE) IMPLANT
SUT PROLENE 3 0 PS 2 (SUTURE) IMPLANT
SUT VIC AB 1 CT1 27 (SUTURE) ×4
SUT VIC AB 1 CT1 27XBRD ANTBC (SUTURE) ×2 IMPLANT
SUT VIC AB 1 CT1 36 (SUTURE) ×3 IMPLANT
SUT VIC AB 1-0 CT2 27 (SUTURE) IMPLANT
SUT VIC AB 2-0 CT1 27 (SUTURE) ×2
SUT VIC AB 2-0 CT1 TAPERPNT 27 (SUTURE) ×1 IMPLANT
SUT VIC AB 2-0 CT2 27 (SUTURE) ×3 IMPLANT
SYR 3ML LL SCALE MARK (SYRINGE) ×3 IMPLANT
TAPE CLOTH SURG 4X10 WHT LF (GAUZE/BANDAGES/DRESSINGS) ×3 IMPLANT
TOWEL OR 17X26 10 PK STRL BLUE (TOWEL DISPOSABLE) ×3 IMPLANT
TOWEL OR NON WOVEN STRL DISP B (DISPOSABLE) ×3 IMPLANT
YANKAUER SUCT BULB TIP NO VENT (SUCTIONS) ×3 IMPLANT

## 2016-10-02 NOTE — Transfer of Care (Signed)
Immediate Anesthesia Transfer of Care Note  Patient: [name]  Procedure(s) Performed: Procedure(s): Microlumbar Decompression L4-5, L3-4, and Discectomy L3-4 (Bilateral)  Patient Location: PACU  Anesthesia Type:General  Level of Consciousness: awake and alert   Airway & Oxygen Therapy: Patient Spontanous Breathing and Patient connected to face mask oxygen  Post-op Assessment: Report given to RN and Post -op Vital signs reviewed and stable  Post vital signs: Reviewed and stable  Last Vitals:  Vitals:   10/02/16 0745  BP: 127/83  Pulse: 83  Resp: 18  Temp: 36.6 C    Last Pain:  Vitals:   10/02/16 0915  TempSrc:   PainSc: 5       Patients Stated Pain Goal: 5 (10/02/16 0834)  Complications: No apparent anesthesia complications

## 2016-10-02 NOTE — Anesthesia Preprocedure Evaluation (Addendum)
Anesthesia Evaluation  Patient identified by MRN, date of birth, ID band Patient awake    Reviewed: Allergy & Precautions, NPO status , Patient's Chart, lab work & pertinent test results  Airway Mallampati: I  TM Distance: >3 FB Neck ROM: Full    Dental no notable dental hx.    Pulmonary neg pulmonary ROS, Current Smoker,    Pulmonary exam normal breath sounds clear to auscultation       Cardiovascular negative cardio ROS Normal cardiovascular exam Rhythm:Regular Rate:Normal     Neuro/Psych negative neurological ROS  negative psych ROS   GI/Hepatic negative GI ROS, Neg liver ROS,   Endo/Other  negative endocrine ROS  Renal/GU negative Renal ROS  negative genitourinary   Musculoskeletal negative musculoskeletal ROS (+)   Abdominal   Peds negative pediatric ROS (+)  Hematology negative hematology ROS (+)   Anesthesia Other Findings   Reproductive/Obstetrics negative OB ROS                            Anesthesia Physical Anesthesia Plan  ASA: II  Anesthesia Plan: General   Post-op Pain Management:    Induction: Intravenous  Airway Management Planned: Oral ETT  Additional Equipment:   Intra-op Plan:   Post-operative Plan: Extubation in OR  Informed Consent: I have reviewed the patients History and Physical, chart, labs and discussed the procedure including the risks, benefits and alternatives for the proposed anesthesia with the patient or authorized representative who has indicated his/her understanding and acceptance.   Dental advisory given  Plan Discussed with: CRNA  Anesthesia Plan Comments:         Anesthesia Quick Evaluation

## 2016-10-02 NOTE — H&P (View-Only) (Signed)
[name] is an 56 y.o. male.   Chief Complaint: Left leg pain, numbness and weakness HPI: The patient is a 56 year old male who presents with back pain. The patient is here today for an IME with take over of care. The patient reports low back symptoms which began 12 week(s) ago following a specific injury. The injury occurred 06/04/2016 at work due to lifting (40-60 lbs) and bending. Symptoms are reported to be located in the low back and Symptoms include pain. The pain radiates to the left buttock, left posterior thigh, left lower leg and left foot. Symptoms are exacerbated by standing (for extended periods of time) and sitting. Current treatment includes nonsteroidal anti-inflammatory drugs. Prior to being seen today the patient was previously evaluated in the ER and at [facility]. Past evaluation has included x-ray of the lumbar spine and MRI of the lumbar spine. Past treatment has included nonsteroidal anti-inflammatory drugs, non-opioid analgesics, muscle relaxants and corticosteroids. 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 Note for "Back pain": The patient is currently working with light duty restrictions.  HISTORY This is a very pleasant gentleman who presents here for an independent medical exam for a work related injury. The patient reports that on 06/04/2016 while he was working and doing well he was lifting an object maybe 40 pounds bending and twisting at that time. He had acute low back pain and that progressed into the left lower extremity radicular pain. Back pain that night and then the next morning got out of bed he had severe pain into the leg. This prompted him to present to the emergency room. This was at [facility] and that was in [place], [place]. He was found and diagnosed with acute low back pain and left lower extremity pain. There was seen by Dr. Lorenso Quarry.  He was will placed on ibuprofen, prednisone and cyclobenzaprine. He felty had  some mild help from that. He was subsequently seen at [facility] on 07/14/2016. Prior to that he was seen by Dr. Pati Gallo, I do not have that medical record. He apparently ordered an MRI. This was performed on 06/30/2016. This was a [facility], this where apparently was noted to have a large disc herniation at 3-4 compressing the 4 root with associated severe stenosis at 3-4 through 4-5. He was at that time taking tramadol, gabapentin. He reported a history of a neuropathy in the right lower extremity, not in the left lower extremity. On December 18, he was subsequently seen by a Dr. Val Eagle and had a consultation. He was found to have severe stenosis, disc herniation on the left. He was left groin and thigh pain as well. Trouble walking. He discussed a decompression from L2 to S1. Weakness and ankle dorsiflexion on the left compared to the right. He was also counseled on tobacco cessation. Started on methocarbamol, tramadol. X-rays indicated no evidence of a spondylolisthesis.  REVIEW OF SYSTEMS Negative for fevers, chest pain, shortness of breath, unexplained recent weight loss, loss of bowel or bladder function, burning with urination, joint swelling, rashes, weakness or numbness, difficulty with balance, easy bruising, excessive thirst or frequent urination. He presents here for a second opinion concerning his current situation. He is taking the gabapentin one to two twice a day.  In terms of his neuropathy on the right he has not a diabetic. There is no specific diagnosis for the neuropathy, but was taken gabapentin and it was helping him.  In terms of  the left leg it is worse when he stands or sits for long periods of time. When he bends or lifts.  He has not had a problem with neck and upper extremity pain, problems with his bladder, ataxia, fevers or chills.  The patient pain drawing is organic. He did not rate his pain. Visual analog scale indicated mid range at this  point. He reports left greater than right pain.  He also reports continuing to work. He has requested perhaps out of work. [place]  Past Medical History Bleeding disorder  Hepatitis C  Hypercholesterolemia  Peripheral Neuropathy   Allergies No Known Drug Allergies [08/27/2016]:  Family History Heart Disease  Maternal Grandfather.  Social History  Children  1 Current drinker  08/27/2016: Currently drinks beer and hard liquor 5-7 times per week Current work status  working full time Exercise  Exercises daily; does other Living situation  live alone Marital status  single No history of drug/alcohol rehab  Not under pain contract  Number of flights of stairs before winded  greater than 5 Tobacco / smoke exposure  08/27/2016: yes outdoors only Tobacco use  Current every day smoker. 08/27/2016: smoke(d) 1/2 pack(s) per day  Medication History Atorvastatin Calcium (20MG  Tablet, Oral) Active.  Past Surgical History Arthroscopy of Knee  bilateral Leg Circulation Surgery  right Tonsillectomy   Review of Systems  Constitutional: Negative.   HENT: Negative.   Eyes: Negative.   Respiratory: Negative.   Cardiovascular: Negative.   Gastrointestinal: Negative.   Genitourinary: Negative.   Musculoskeletal: Positive for back pain.  Skin: Negative.   Neurological: Positive for sensory change and focal weakness.  Endo/Heme/Allergies: Negative.   Psychiatric/Behavioral: Negative.    Vitals 08/27/2016 3:48 PM Weight: 185 lb Height: 71in Body Surface Area: 2.04 m Body Mass Index: 25.8 kg/m  BP: 123/77 (Sitting, Right Arm, Standard)  Physical Exam  Constitutional: He is oriented to person, place, and time. He appears well-developed and well-nourished.  HENT:  Head: Normocephalic.  Eyes: Pupils are equal, round, and reactive to light.  Neck: Normal range of motion.  Cardiovascular: Normal rate.    Respiratory: Effort normal.  GI: Soft.  Musculoskeletal:  On exam, he is a healthy male in mild to moderate distress, mood and affect, probably with sliding antalgic gait. He has tender lumbosacral junction, left PSIS. He has pain with extension and flexion of lumbar spine both limited. Straight leg raises buttock, thigh and calf pain on the left, negative on the right. He has bilateral EHL weakness. He does have some 5-/5 dorsiflexion of tibialis anterior on the left, he has 5-/5 quadriceps strength on left compared to the right. He is hyperreflexic, sensory exam in L5 dermatome bilaterally. Lumbar spine exam reveals no evidence of soft tissue swelling, deformity or skin ecchymosis. No flank pain with percussion. The abdomen is soft and nontender. Nontender over the trochanters. No cellulitis or lymphadenopathy.  Good range of motion of the lumbar spine without associated pain. There is no Babinski or clonus. Patient has good distal pulses. No DVT. No pain and normal range of motion without instability of the hips, knees and ankles.  Cervical spine, he has painless range of motion. He has some pain with end forward flexion and extension but no radicular pain. Inspection of the cervical spine reveals a normal lordosis without evidence of paraspinous spasms or soft tissue swelling. Nontender to palpation. Full extension, full left and right lateral rotation. Extension combined with lateral  flexion does not reproduce pain. Negative impingement sign, negative secondary impingement sign of the shoulders. Negative Tinel's median and ulnar nerves at the elbow. Negative carpal compression test at the wrist. Motor of the upper extremities is 5/5 including biceps, triceps, brachioradialis, wrist flexion, wrist extension, finger flexion, finger extension. Reflexes are normoreflexic. Sensory exam is intact to light touch. There is no Hoffmann sign. Nontender over the thoracic spine.  Neurological: He is alert and  oriented to person, place, and time.  Skin: Skin is warm and dry.    Imaging AP, lateral, flexion, extension radiographs of lumbar spine demonstrates a mild lumbar scoliosis, degenerative. He has mild osteoarthrosis. He has moderate osteoarthrosis of the left hip, mild on the right with a Cam impingement deformity. He has cystic changes in the femoral heads bilaterally. Flexion extension radiographs demonstrate multilevel disc degeneration, severe at 5-1 with a vacuum phenomena and neuroforaminal stenosis of L5. He has severe disc degeneration at 4-5 as well. No instability in flexion extension. He has moderate degenerative changes at 3-4 and at 2-3.  MRI December 4 demonstrates severe multifactorial stenosis at 3-4 and at 4-5. The acute disc herniation is noted at 3-4 extending caudad paracentral to the left.  L5-S1, he has facet arthropathy and he has some lateral recess stenosis at 5-1.  Assessment/Plan 1. L4 radiculopathy secondary to acute disc herniation at 3-4. 2. Underlying severe congenital and acquired spinal stenosis at 3-4 and at 4-5. 3. Multilevel disc degeneration, severe at 4-5 and at 5-1, moderate 3-4 and at 2-3 without instability on flexion, extension. 4. History of diagnosed peripheral neuropathy with likely chronic radiculopathy secondary to his spinal stenosis.  PLAN, RECOMMENDATION AND DISCUSSION Extensive discussion concerning his current pathology, relevant anatomy and treatment options. I do feel given his mechanism of injury as described it is sufficient to produce an acute disc herniation, which is noted by his symptomatology as well as his MRI given the disc herniation at 3-4. He does have underlying preexisting spinal stenosis at 3-4 and at 4-5 and is most likely the cause of his diagnosed chronic neuropathy on the right given his severe stenosis noted at 4-5.  I do agree that he will likely require a decompression to decompress at 3-4 nerve root as he has a paucity of  capacity in the spinal canal to accommodate an acute disc herniation of this size and given his minor neurologic deficit I do not feel that epidural steroid injection would be particularly efficacious at this point. I do feel, however, at least an urgent evaluation by a Mckenzie based therapist would be appropriate to determine whether he has any positional based reduction in his radiculopathy or centralization. I do, however, agree that given the mass effect of the disc herniation with his concomitant spinal stenosis it would require surgical decompression to gain access to the disc herniation at 3-4. It will require a decompression most likely at 3-4 removing neural arch of 3 and 4 and decompressing 3-4, 4-5 extending possibly into 2-3. He does have some mild stenosis at 5-1 and may require a hemidecompression L5-S1 on the left. He does have some evidence of an S1 radiculopathy, though it is most likely secondary to compression of the thecal sac at 4-5.  In the interim, we discussed activity modification, strategy to avoid reinjury, he is to avoid extention, favor flexion, I would recommend no prolonged standing or sitting over 30 minutes of time, no repetitive bending. No lifting over 10 pounds.  I do recommend analgesics. Gabapentin  has been particularly beneficial to him in the past and recommend 300 mg titrating up to three tablets three times a day for effect, in the interim an underlying anti-inflammatory medication with activity modification.  We discussed his pathology, relevant anatomy, reviewed his x-rays, MRI, his previous symptoms of a neuropathy and its relationship to his underlying stenosis, the mechanism of injury and his treatment options. At this point, I do agree that tobacco cessation is prerequisite as well indicating deleterious side effects upon healing, epidural fibrosis and infection rates. Typically this would require a decompression overnight in the hospital, sutures out in two  weeks and re-engage in formal supervised physical therapy until six weeks, at which point he could engage in a work conditioning type program and maximum medical improvement three months status post. Again the acute injury is his disc herniation, which is also exacerbate his underlying spinal stenosis and to address the disc herniation it would require decompressing his underlying spinal stenosis. I do not feel that he would require instrumentation. There is no sign of instability or foraminal compression that significant to that degree. He has minor mechanical back pain, he understands.  He does have a component of osteoarthrosis in his hips. He does not have severe or significant groin pain. I have discussed that in terms of future observation, avoid pivoting and further imaging if he develops groin pain or pivoting pain within the left groin.  We discussed this in extensive detail. This is currently an independent medical exam only. If he is so chooses to initiate treatment here we will be happy to assist in any manner in his care. He will discuss this with his adjuster. I would again recommend at least a session or two of Thea Silversmith based therapy which most likely will require Williams flexion based program, his gabapentin and then a repeat examination to determine its efficacy. He does seem to be somewhat better since his initial presentation in terms of severity of the pain. However, given his ongoing radicular pain and his clinical exam, I favor at this point decompression.  I appreciate the kind referral and ability to evaluate Mr. Ines.  Plan microlumbar decompression L3-4, L4-5  Fortune Brannigan, Dayna Barker., PA-C for Dr. Shelle Iron 09/12/2016, 10:08 AM

## 2016-10-02 NOTE — Brief Op Note (Signed)
10/02/2016  12:44 PM  PATIENT:  Tressia MinersScott Dhawan  56 y.o. male  PRE-OPERATIVE DIAGNOSIS:  Stenosis L4-5, L3-4  POST-OPERATIVE DIAGNOSIS:  Stenosis L4-5, L3-4  PROCEDURE:  Procedure(s): Microlumbar Decompression L4-5, L3-4, and Discectomy L3-4 (Bilateral)  SURGEON:  Surgeon(s) and Role:    * Jene EveryJeffrey Berlin Viereck, MD - Primary  PHYSICIAN ASSISTANT:   ASSISTANTS: Bissell   ANESTHESIA:   general  EBL:  Total I/O In: 2000 [I.V.:2000] Out: 700 [Urine:600; Blood:100]  BLOOD ADMINISTERED:none  DRAINS: none   LOCAL MEDICATIONS USED:  MARCAINE     SPECIMEN:  Source of Specimen:  L34  DISPOSITION OF SPECIMEN:  PATHOLOGY  COUNTS:  YES  TOURNIQUET:  * No tourniquets in log *  DICTATION: .Other Dictation: Dictation Number [id number]  PLAN OF CARE: Admit for overnight observation  PATIENT DISPOSITION:  PACU - hemodynamically stable.   Delay start of Pharmacological VTE agent (>24hrs) due to surgical blood loss or risk of bleeding: yes

## 2016-10-02 NOTE — Anesthesia Procedure Notes (Signed)
Procedure Name: Intubation Date/Time: 10/02/2016 10:10 AM Performed by: Leroy LibmanEARDON, Curtis Aversa L Patient Re-evaluated:Patient Re-evaluated prior to inductionOxygen Delivery Method: Circle system utilized Preoxygenation: Pre-oxygenation with 100% oxygen Ventilation: Mask ventilation without difficulty and Oral airway inserted - appropriate to patient size Laryngoscope Size: Miller and 3 Grade View: Grade II Tube type: Oral Tube size: 8.0 mm Number of attempts: 1 Airway Equipment and Method: Stylet Placement Confirmation: ETT inserted through vocal cords under direct vision,  positive ETCO2 and breath sounds checked- equal and bilateral Secured at: 22 cm Tube secured with: Tape Dental Injury: Teeth and Oropharynx as per pre-operative assessment

## 2016-10-02 NOTE — Discharge Instructions (Signed)

## 2016-10-02 NOTE — Anesthesia Postprocedure Evaluation (Signed)
Anesthesia Post Note  Patient: [name]  Procedure(s) Performed: Procedure(s) (LRB): Microlumbar Decompression L4-5, L3-4, and Discectomy L3-4 (Bilateral)  Patient location during evaluation: PACU Anesthesia Type: General Level of consciousness: sedated and patient cooperative Pain management: pain level controlled Vital Signs Assessment: post-procedure vital signs reviewed and stable Respiratory status: spontaneous breathing Cardiovascular status: stable Anesthetic complications: no       Last Vitals:  Vitals:   10/02/16 1415 10/02/16 1515  BP: (!) 142/82 (!) 155/95  Pulse:  89  Resp: 13 14  Temp: 36.5 C 36.5 C    Last Pain:  Vitals:   10/02/16 1515  TempSrc: Oral  PainSc:                  [name]

## 2016-10-02 NOTE — Evaluation (Signed)
Physical Therapy Evaluation Patient Details Name: [name] MRN: [id number] DOB: 06-Oct-1960 Today's Date: 10/02/2016   History of Present Illness  Microlumbar Decompression L4-5, L3-4, and Discectomy L3-4 (Bilateral  Clinical Impression  The patient ambulated x 100' today. Will assess need for RW next visit. Pt admitted with above diagnosis. Pt currently with functional limitations due to the deficits listed below (see PT Problem List).  Pt will benefit from skilled PT to increase their independence and safety with mobility to allow discharge to the venue listed below.       Follow Up Recommendations No PT follow up    Equipment Recommendations  Rolling walker with 5" wheels    Recommendations for Other Services OT consult     Precautions / Restrictions Precautions Precautions: Back Precaution Comments: reviewed No BLT Restrictions Weight Bearing Restrictions: No      Mobility  Bed Mobility Overal bed mobility: Needs Assistance Bed Mobility: Rolling;Sidelying to Sit;Sit to Sidelying Rolling: Min guard Sidelying to sit: Min guard     Sit to sidelying: Min guard General bed mobility comments: cues for precautions  Transfers Overall transfer level: Needs assistance Equipment used: Rolling walker (2 wheeled) Transfers: Sit to/from Stand Sit to Stand: Min guard         General transfer comment: cues for technique  Ambulation/Gait Ambulation/Gait assistance: Min guard Ambulation Distance (Feet): 100 Feet Assistive device: Rolling walker (2 wheeled) Gait Pattern/deviations: Step-through pattern     General Gait Details: cues for sequence  Stairs            Wheelchair Mobility    Modified Rankin (Stroke Patients Only)       Balance                                             Pertinent Vitals/Pain Pain Assessment: 0-10 Pain Score: 4  Pain Location: back Pain Descriptors / Indicators: Sore Pain Intervention(s): Monitored  during session;Premedicated before session;Ice applied;Repositioned    Home Living Family/patient expects to be discharged to:: Private residence Living Arrangements: Alone Available Help at Discharge: Family;Available PRN/intermittently Type of Home: House Home Access: Stairs to enter   [employer] of Steps: 1-3 Home Layout: One level Home Equipment: None      Prior Function Level of Independence: Independent               Hand Dominance        Extremity/Trunk Assessment   Upper Extremity Assessment Upper Extremity Assessment: Defer to OT evaluation    Lower Extremity Assessment Lower Extremity Assessment: Overall WFL for tasks assessed    Cervical / Trunk Assessment Cervical / Trunk Assessment: Normal  Communication   Communication: No difficulties  Cognition Arousal/Alertness: Awake/alert Behavior During Therapy: WFL for tasks assessed/performed Overall Cognitive Status: Within Functional Limits for tasks assessed                      General Comments      Exercises     Assessment/Plan    PT Assessment Patient needs continued PT services  PT Problem List Decreased activity tolerance;Decreased knowledge of precautions;Decreased knowledge of use of DME       PT Treatment Interventions DME instruction;Gait training;Stair training;Functional mobility training;Therapeutic activities;Patient/family education    PT Goals (Current goals can be found in the Care Plan section)  Acute Rehab PT Goals Patient Stated Goal:  to walk without pain PT Goal Formulation: With patient/family Time For Goal Achievement: 10/04/16 Potential to Achieve Goals: Good    Frequency Min 5X/week   Barriers to discharge Decreased caregiver support      Co-evaluation               End of Session   Activity Tolerance: Patient tolerated treatment well Patient left: in bed;with call bell/phone within reach;with family/visitor present Nurse  Communication: Mobility status PT Visit Diagnosis: Pain;Difficulty in walking, not elsewhere classified (R26.2) Pain - part of body:  (back)    Functional Assessment Tool Used: Clinical judgement;AM-PAC 6 Clicks Basic Mobility Functional Limitation: Mobility: Walking and moving around Mobility: Walking and Moving Around Current Status ([id number](G8978): At least 1 percent but less than 20 percent impaired, limited or restricted Mobility: Walking and Moving Around Goal Status [phone](G8979): 0 percent impaired, limited or restricted    Time: [id number] PT Time Calculation (min) (ACUTE ONLY): 24 min   Charges:         PT G Codes:   PT G-Codes **NOT FOR INPATIENT CLASS** Functional Assessment Tool Used: Clinical judgement;AM-PAC 6 Clicks Basic Mobility Functional Limitation: Mobility: Walking and moving around Mobility: Walking and Moving Around Current Status ([id number](G8978): At least 1 percent but less than 20 percent impaired, limited or restricted Mobility: Walking and Moving Around Goal Status [phone]([id number]): 0 percent impaired, limited or restricted     Rada HayHill, Nasim Garofano Elizabeth 10/02/2016, 5:20 PM Blanchard KelchKaren Levi Crass PT [phone]

## 2016-10-02 NOTE — Interval H&P Note (Signed)
History and Physical Interval Note:  10/02/2016 9:57 AM  Curtis MinersScott Coach  has presented today for surgery, with the diagnosis of Stenosis L4-5, L3-4  The various methods of treatment have been discussed with the patient and family. After consideration of risks, benefits and other options for treatment, the patient has consented to  Procedure(s): Microlumbar decompression L4-5, L3-4 (N/A) as a surgical intervention .  The patient's history has been reviewed, patient examined, no change in status, stable for surgery.  I have reviewed the patient's chart and labs.  Questions were answered to the patient's satisfaction.     Netanya Yazdani C

## 2016-10-03 ENCOUNTER — Encounter (HOSPITAL_COMMUNITY): Payer: Self-pay | Admitting: Specialist

## 2016-10-03 DIAGNOSIS — M5116 Intervertebral disc disorders with radiculopathy, lumbar region: Secondary | ICD-10-CM | POA: Diagnosis not present

## 2016-10-03 LAB — CBC
HEMATOCRIT: 42.2 % (ref 39.0–52.0)
HEMOGLOBIN: 14.4 g/dL (ref 13.0–17.0)
MCH: 34.7 pg — ABNORMAL HIGH (ref 26.0–34.0)
MCHC: 34.1 g/dL (ref 30.0–36.0)
MCV: 101.7 fL — AB (ref 78.0–100.0)
Platelets: 142 10*3/uL — ABNORMAL LOW (ref 150–400)
RBC: 4.15 MIL/uL — ABNORMAL LOW (ref 4.22–5.81)
RDW: 12.9 % (ref 11.5–15.5)
WBC: 12.2 10*3/uL — AB (ref 4.0–10.5)

## 2016-10-03 NOTE — Progress Notes (Signed)
Generic Worker's Comp with [employer] for DME [phone] was called. It will take awhile to get approved for 3 n 1 for pt. Pt understand and selected to purchase 3 n 1, then turn it in to his insurance. [facility] given referral.

## 2016-10-03 NOTE — Progress Notes (Signed)
Physical Therapy Treatment Patient Details Name: [name] MRN: [id number] DOB: 1960-11-30 Today's Date: 10/03/2016    History of Present Illness Microlumbar Decompression L4-5, L3-4, and Discectomy L3-4 (Bilateral    PT Comments    POD # 1 Assisted OOB via "log roll" and amb in hallway without any AD.  Good alternating gait.  Increased distance.  Practiced stairs.  Returned to room and reviewed back precautions and lifting restrictions.  Given a post op back booklet and reviewed sleeping positions with use of pillows, some exercises, home safety and use of ICE.    Follow Up Recommendations  No PT follow up     Equipment Recommendations  Rolling walker with 5" wheels (if pt feels he need one)    Recommendations for Other Services       Precautions / Restrictions Precautions Precautions: Back Precaution Comments: reviewed BAT/BLT back precautions and lifting restrictions.  Handout also given Restrictions Weight Bearing Restrictions: No    Mobility  Bed Mobility Overal bed mobility: Needs Assistance Bed Mobility: Rolling;Sidelying to Sit;Supine to Sit Rolling: Supervision Sidelying to sit: Supervision Supine to sit: Supervision   Sit to sidelying: Supervision General bed mobility comments: 25% VC's on proper tech "log roll"   Transfers Overall transfer level: Modified independent Equipment used: None Transfers: Sit to/from Stand Sit to Stand: Supervision         General transfer comment: one VC on turn completion prior to sit  Ambulation/Gait Ambulation/Gait assistance: Supervision Ambulation Distance (Feet): 285 Feet Assistive device: None Gait Pattern/deviations: Step-through pattern Gait velocity: WFL   General Gait Details: good alternating gait with equal WBing, stance time and swing phase.     Stairs Stairs: Yes   Stair Management: Step to pattern;One rail Left Number of Stairs: 4 General stair comments: 25% VC's on step to and stronger leg  up first then weakner leg down first.    Wheelchair Mobility    Modified Rankin (Stroke Patients Only)       Balance                                    Cognition Arousal/Alertness: Awake/alert Behavior During Therapy: WFL for tasks assessed/performed Overall Cognitive Status: Within Functional Limits for tasks assessed                      Exercises      General Comments        Pertinent Vitals/Pain Pain Assessment: 0-10 Pain Score: 2  Pain Location: back Pain Descriptors / Indicators: Sore Pain Intervention(s): Monitored during session;Repositioned;Ice applied    Home Living                      Prior Function            PT Goals (current goals can now be found in the care plan section) Progress towards PT goals: Progressing toward goals    Frequency    Min 5X/week      PT Plan Current plan remains appropriate    Co-evaluation             End of Session Equipment Utilized During Treatment: Gait belt Activity Tolerance: Patient tolerated treatment well Patient left: in bed;with call bell/phone within reach Nurse Communication:  (pt ready for D/C to home) PT Visit Diagnosis: Pain;Difficulty in walking, not elsewhere classified (R26.2)     Time: 3016-0109 PT Time  Calculation (min) (ACUTE ONLY): 27 min  Charges:  $Gait Training: 8-22 mins $Therapeutic Activity: 8-22 mins                    G Codes:       Felecia ShellingLori Dezerae Freiberger  PTA [facility]  Acute  Rehab Pager      [phone]

## 2016-10-03 NOTE — Evaluation (Signed)
Occupational Therapy Evaluation Patient Details Name: [name] MRN: [id number] DOB: 07/14/1961 Today's Date: 10/03/2016    History of Present Illness Microlumbar Decompression L4-5, L3-4, and Discectomy L3-4 (Bilateral   Clinical Impression   This 56 year old man was admitted for the above. All education was completed.  No further OT is needed at this time    Follow Up Recommendations  No OT follow up    Equipment Recommendations  3 in 1 bedside commode    Recommendations for Other Services       Precautions / Restrictions Precautions Precautions: Back Precaution Comments: reviewed No BLT Restrictions Weight Bearing Restrictions: No      Mobility Bed Mobility               General bed mobility comments: OOB; verbalizes technique  Transfers Overall transfer level: Independent                    Balance                                            ADL Overall ADL's : Needs assistance/impaired     Grooming: Supervision/safety   Upper Body Bathing: Set up;Sitting   Lower Body Bathing: Minimal assistance;Sit to/from stand   Upper Body Dressing : Set up;Sitting   Lower Body Dressing: Minimal assistance;Sit to/from stand Lower Body Dressing Details (indicate cue type and reason): socks only--will have assistance Toilet Transfer: Modified Independent;Ambulation;BSC   Toileting- Clothing Manipulation and Hygiene: Modified independent         General ADL Comments: educated on back precautions and adls.  Pt will have someone stop over daily.  Recommended reacher for light weight items that fall.  Issued and reviewed back handout     Vision         Perception     Praxis      Pertinent Vitals/Pain Pain Score: 2  Pain Location: back Pain Descriptors / Indicators: Sore Pain Intervention(s): Limited activity within patient's tolerance;Monitored during session;Premedicated before session     Hand Dominance      Extremity/Trunk Assessment             Communication     Cognition Arousal/Alertness: Awake/alert Behavior During Therapy: WFL for tasks assessed/performed                       General Comments       Exercises       Shoulder Instructions      Home Living Family/patient expects to be discharged to:: Private residence Living Arrangements: Alone Available Help at Discharge: Family;Available PRN/intermittently               Bathroom Shower/Tub: Tub/shower unit Shower/tub characteristics: Engineer, building services: Standard                Prior Functioning/Environment                   OT Problem List:        OT Treatment/Interventions:      OT Goals(Current goals can be found in the care plan section) Acute Rehab OT Goals Patient Stated Goal: to walk without pain OT Goal Formulation: All assessment and education complete, DC therapy  OT Frequency:     Barriers to D/C:  Co-evaluation              End of Session    Activity Tolerance: Patient tolerated treatment well Patient left: in bed  OT Visit Diagnosis: Muscle weakness (generalized) (M62.81)                ADL either performed or assessed with clinical judgement  Time: [id number] OT Time Calculation (min): 18 min Charges:  OT General Charges $OT Visit: 1 Procedure OT Evaluation $OT Eval Low Complexity: 1 Procedure G-Codes: OT G-codes **NOT FOR INPATIENT CLASS** Functional Assessment Tool Used: AM-PAC 6 Clicks Daily Activity;Clinical judgement Functional Limitation: Self care Self Care Current Status ([id number](G8987): At least 20 percent but less than 40 percent impaired, limited or restricted Self Care Goal Status (J1914(G8988): At least 20 percent but less than 40 percent impaired, limited or restricted Self Care Discharge Status [phone](G8989): At least 20 percent but less than 40 percent impaired, limited or restricted   Marica OtterMaryellen Jancarlo Biermann,  OTR/L [phone] 10/03/2016  [name] 10/03/2016, 8:24 AM

## 2016-10-03 NOTE — Discharge Summary (Signed)
Physician Discharge Summary   Patient ID: [name] MRN: [id number] DOB/AGE: 09/20/1960 56 y.o.  Admit date: 10/02/2016 Discharge date: 10/03/2016  Primary Diagnosis:   Stenosis L4-5, L3-4  Admission Diagnoses:  Past Medical History:  Diagnosis Date  . Lumbar herniated disc    l3 l4 l5   Discharge Diagnoses:   Principal Problem:   Spinal stenosis of lumbar region Active Problems:   Spinal stenosis at L4-L5 level  Procedure:  Procedure(s) (LRB): Microlumbar Decompression L4-5, L3-4, and Discectomy L3-4 (Bilateral)   Consults: None  HPI:  see H&P    Laboratory Data: Hospital Outpatient Visit on 09/15/2016  Component Date Value Ref Range Status  . WBC 09/15/2016 6.0  4.0 - 10.5 K/uL Final  . RBC 09/15/2016 4.44  4.22 - 5.81 MIL/uL Final  . Hemoglobin 09/15/2016 15.8  13.0 - 17.0 g/dL Final  . HCT 09/15/2016 45.0  39.0 - 52.0 % Final  . MCV 09/15/2016 101.4* 78.0 - 100.0 fL Final  . MCH 09/15/2016 35.6* 26.0 - 34.0 pg Final  . MCHC 09/15/2016 35.1  30.0 - 36.0 g/dL Final  . RDW 09/15/2016 13.1  11.5 - 15.5 % Final  . Platelets 09/15/2016 155  150 - 400 K/uL Final  . MRSA, PCR 09/15/2016 NEGATIVE  NEGATIVE Final  . Staphylococcus aureus 09/15/2016 NEGATIVE  NEGATIVE Final   Comment:        The Xpert SA Assay (FDA approved for NASAL specimens in patients over 59 years of age), is one component of a comprehensive surveillance program.  Test performance has been validated by [facility] for patients greater than or equal to 68 year old. It is not intended to diagnose infection nor to guide or monitor treatment.   . Sodium 09/15/2016 138  135 - 145 mmol/L Final  . Potassium 09/15/2016 4.6  3.5 - 5.1 mmol/L Final  . Chloride 09/15/2016 105  101 - 111 mmol/L Final  . CO2 09/15/2016 23  22 - 32 mmol/L Final  . Glucose, Bld 09/15/2016 88  65 - 99 mg/dL Final  . BUN 09/15/2016 14  6 - 20 mg/dL Final  . Creatinine, Ser 09/15/2016 0.81  0.61 - 1.24 mg/dL Final  .  Calcium 09/15/2016 9.5  8.9 - 10.3 mg/dL Final  . Total Protein 09/15/2016 8.1  6.5 - 8.1 g/dL Final  . Albumin 09/15/2016 4.6  3.5 - 5.0 g/dL Final  . AST 09/15/2016 40  15 - 41 U/L Final  . ALT 09/15/2016 34  17 - 63 U/L Final  . Alkaline Phosphatase 09/15/2016 77  38 - 126 U/L Final  . Total Bilirubin 09/15/2016 0.3  0.3 - 1.2 mg/dL Final  . GFR calc non Af Amer 09/15/2016 >60  >60 mL/min Final  . GFR calc Af Amer 09/15/2016 >60  >60 mL/min Final   Comment: (NOTE) The eGFR has been calculated using the CKD EPI equation. This calculation has not been validated in all clinical situations. eGFR's persistently <60 mL/min signify possible Chronic Kidney Disease.   . Anion gap 09/15/2016 10  5 - 15 Final  . Prothrombin Time 09/15/2016 11.7  11.4 - 15.2 seconds Final  . INR 09/15/2016 0.86   Final   No results for input(s): HGB in the last 72 hours. No results for input(s): WBC, RBC, HCT, PLT in the last 72 hours. No results for input(s): NA, K, CL, CO2, BUN, CREATININE, GLUCOSE, CALCIUM in the last 72 hours. No results for input(s): LABPT, INR in the last 72 hours.  X-Rays:Dg Lumbar Spine 2-3 Views  Result Date: 09/15/2016 CLINICAL DATA:  Preop HNP EXAM: LUMBAR SPINE - 2-3 VIEW COMPARISON:  None. FINDINGS: Mild lumbar scoliosis. Normal alignment. Negative for fracture or mass. Mild disc space narrowing and spurring at L3-4 and L4-5 and L5-S1. Lowest disc space L5-S1. IMPRESSION: Scoliosis and mild lumbar degenerative change. Lumbar disc spaces are numbered for preoperative planning purposes. Electronically Signed   By: Franchot Gallo M.D.   On: 09/15/2016 15:57   Dg Spine Portable 1 View  Result Date: 10/02/2016 CLINICAL DATA:  Elective surgery, decompression and discectomy lumbar spine EXAM: PORTABLE SPINE - 1 VIEW COMPARISON:  Prior film lumbar spine same day FINDINGS: Single lateral view of the lumbar spine submitted. Again noted a soft tissue spreader device at the level of spinous  process of L3 and L4 vertebral body. There is a inferior posterior localization instrument with the tip at the level of upper endplate of L5 vertebral body. Second oblique posterior localization instrument with inferior approach with tip of the instrument at L3 level. There is a third posterior localization instrument with its tip at the upper posterior aspect of L4 vertebral body. A band like material is overlying L4-L5 posterior disc space. IMPRESSION: Again noted a soft tissue spreader device at the level of spinous process of L3 and L4 vertebral body. There is a inferior posterior localization instrument with the tip at the level of upper endplate of L5 vertebral body. Second oblique posterior localization instrument with inferior approach with tip of the instrument at L3 level. There is a third posterior localization instrument with its tip at the upper posterior aspect of L4 vertebral body. A band like material is overlying L4-L5 posterior disc space. Electronically Signed   By: Lahoma Crocker M.D.   On: 10/02/2016 12:33   Dg Spine Portable 1 View  Addendum Date: 10/02/2016   ADDENDUM REPORT: 10/02/2016 11:47 ADDENDUM: The spinous processes have been numbered. The tissue spreader device overlies the inferior aspect of the L2 spinous process, the entire L3 spinous process, and the upper 1/2 of the L4 spinous process. Electronically Signed   By: David  Martinique M.D.   On: 10/02/2016 11:47   Result Date: 10/02/2016 CLINICAL DATA:  Intraoperative radiograph prior lumbar laminectomy. EXAM: PORTABLE SPINE - 1 VIEW COMPARISON:  Lateral view of the lumbar spine of earlier today. FINDINGS: The current image is labeled image 2. The tissue spreader device overlies the spinous processes of L3 and L4. IMPRESSION: The tissue spreader device overlies the spinous processes at L3 and L4. Electronically Signed: By: David  Martinique M.D. On: 10/02/2016 11:08   Dg Spine Portable 1 View  Result Date: 10/02/2016 CLINICAL DATA:   Lumbar surgery. EXAM: PORTABLE SPINE - 1 VIEW COMPARISON:  09/15/2016 FINDINGS: Single lateral lumbar spine image was obtained in the operating room for localization purposes. There is a needle in the posterior soft tissues directed at the lower level of the L5 spinous process. Diffuse lumbar disc degeneration and spurring. IMPRESSION: Needle directed at the lower level of the L5 spinous process. Electronically Signed   By: Franchot Gallo M.D.   On: 10/02/2016 11:03    EKG:No orders found for this or any previous visit.   Hospital Course: Patient was admitted to [facility] and taken to the OR and underwent the above state procedure without complications.  Patient tolerated the procedure well and was later transferred to the recovery room and then to the orthopaedic floor for postoperative care.  They were  given PO and IV analgesics for pain control following their surgery.  They were given 24 hours of postoperative antibiotics.   PT was consulted postop to assist with mobility and transfers.  The patient was allowed to be WBAT with therapy and was taught back precautions. Discharge planning was consulted to help with postop disposition and equipment needs.  Patient had a good night on the evening of surgery and started to get up OOB with therapy on day one. Patient was seen in rounds and was ready to go home on day one.  They were given discharge instructions and dressing directions.  They were instructed on when to follow up in the office with Dr. Tonita Cong.   Diet: Regular diet Activity:WBAT; Lspine precautions Follow-up:in 14 days Disposition - Home Discharged Condition: good   Discharge Instructions    Call MD / Call 911    Complete by:  As directed    If you experience chest pain or shortness of breath, CALL 911 and be transported to the hospital emergency room.  If you develope a fever above 101 F, pus (white drainage) or increased drainage or redness at the wound, or calf pain, call  your surgeon's office.   Constipation Prevention    Complete by:  As directed    Drink plenty of fluids.  Prune juice may be helpful.  You may use a stool softener, such as Colace (over the counter) 100 mg twice a day.  Use MiraLax (over the counter) for constipation as needed.   Diet - low sodium heart healthy    Complete by:  As directed    Increase activity slowly as tolerated    Complete by:  As directed      Allergies as of 10/03/2016   No Known Allergies     Medication List    STOP taking these medications   cyclobenzaprine 10 MG tablet Commonly known as:  FLEXERIL   FISH OIL PO     TAKE these medications   acetaminophen 500 MG tablet Commonly known as:  TYLENOL Take 500-1,000 mg by mouth every 6 (six) hours as needed for moderate pain (depends on pain level if takes 1-2 tablets).   atorvastatin 20 MG tablet Commonly known as:  LIPITOR Take 20 mg by mouth every evening.   docusate sodium 100 MG capsule Commonly known as:  COLACE Take 1 capsule (100 mg total) by mouth 2 (two) times daily as needed for mild constipation.   gabapentin 300 MG capsule Commonly known as:  NEURONTIN Take 300 mg by mouth 3 (three) times daily.   ibuprofen 200 MG tablet Commonly known as:  ADVIL,MOTRIN Take 3 tablets (600 mg total) by mouth every 8 (eight) hours as needed for mild pain. Resume 5 days post-op as needed What changed:  additional instructions   methocarbamol 500 MG tablet Commonly known as:  ROBAXIN Take 1 tablet (500 mg total) by mouth every 6 (six) hours as needed for muscle spasms.   oxyCODONE-acetaminophen 5-325 MG tablet Commonly known as:  PERCOCET Take 1-2 tablets by mouth every 4 (four) hours as needed for severe pain.   polyethylene glycol packet Commonly known as:  MIRALAX / GLYCOLAX Take 17 g by mouth daily.      Follow-up Information    BEANE,JEFFREY C, MD Follow up in 2 week(s).   Specialty:  Orthopedic Surgery Contact information: [address] [place] [address] [phone]           Signed: Lacie Draft,  PA-C Orthopaedic Surgery 10/03/2016, 8:08 AM

## 2016-10-03 NOTE — Op Note (Signed)
[name], [name]              ACCOUNT NO.:  [id number]  MEDICAL RECORD NO.:  [id number]  LOCATION:                                 FACILITY:  PHYSICIAN:  Jene Every, M.D.         DATE OF BIRTH:  DATE OF PROCEDURE:  10/02/2016 DATE OF DISCHARGE:                              OPERATIVE REPORT   PREOPERATIVE DIAGNOSES: 1. Spinal stenosis, L3-4, L4-5.  Congenital and acquired. 2. Herniated disk L3-4.  POSTOPERATIVE DIAGNOSES: 1. Spinal stenosis, L3-4, L4-5.  Congenital and acquired. 2. Herniated disk L3-4.  PROCEDURE PERFORMED: 1. Microlumbar decompression L3-4, L4-5. 2. Gill laminectomy L4. 3. Microdiskectomy L3-4, left. 4. Foraminotomies L4 and L5 bilaterally.  ANESTHESIA:  General.  ASSISTANT:  Lanna Poche, PA.  HISTORY:  A 56 year old with lower extremity radicular pain secondary to large disk herniation, 3-4 extending caudad.  With underlying congenital and acquired spinal stenosis.  He has history of neuropathic pain.  He was indicated for decompression of L3-4.  However, to gain access to the disk herniation migrating caudad due to underlying severe stenosis will require central decompression at both levels at 3-4 and at 4-5.  Risks and benefits discussed including bleeding, infection, damage to neurovascular structures, no change in symptoms, worsening symptoms, DVT, PE, anesthetic complications, etc.  TECHNIQUE:  With the patient in supine position, after induction of adequate general anesthesia, 2 g Kefzol, placed prone on the [place] frame.  All bony prominences were well padded.  Lumbar region was prepped and draped in the usual sterile fashion.  Two 18-gauge spinal needle were utilized to localize the spinous process of 3-4. Subcutaneous tissue was dissected.  Electrocautery was utilized to achieve hemostasis.  A 0.25% Marcaine with epinephrine was infiltrated in the paraspinous musculature for hemostasis and local anesthesia.  We divided the  dorsolumbar fascia in line with the skin incision. Paraspinous muscles elevated from lamina of L3-4 and L4-5.  McCullough retractor was placed.  Operating microscope was draped and brought on the surgical field.  We removed the spinous processes of L4 and L3, partial L5.  This was a Gaffer.  Hemilaminotomy of the caudad edge of L3 was performed bilaterally to the point cephalad detaching the ligamentum flavum.  We then placed a Woodson retractor beneath the ligamentum flavum.  We detached the ligamentum flavum from the cephalad edge of L4 with a micro curette.  Removed ligamentum flavum from the interspace and decompressed the lateral recess to the medial border of the pedicle bilaterally.  He had severe bilateral stenosis.  We performed a hemilaminotomies of L4 bilaterally.  Then at L4 and L5. Utilized a straight curette to detach the ligamentum flavum from the cephalad edge of L5.  Then, performed hemilaminotomy of the caudad edge of L4 bilaterally and centrally to removed the neural arch of L4.  We then decompressed lateral recess to the medial border of the pedicle at L5 bilaterally and performing foraminotomies of L4 and of L5.  He had severe stenosis centrally and bilaterally at L5 secondary to facet ligamentum flavum hypertrophy.  There was no disk herniation noted at L4- 5.  However, at L3-4 to the left it was noted to caudad.  There were severe adhesions noted of the thecal sac and the L4 root laterally.  We meticulously developed a plane between the thecal sac at the origin of the L4 root and a disk herniation.  I then used bipolar cautery utilized to achieve hemostasis as was bone wax and thrombin-soaked Gelfoam.  I mobilized 2 free fragments of the disk material with a micropituitary, mobilized with a nerve hook.  I performed an annulotomy.  Copious portion of disk material was removed from the disk space with a micropituitary.  It was further mobilized with an  Epstein, a catheter irrigation with antibiotic and removal of additional fragments and then beneath, and out into the foramen of L4 and cephalad as well.  We obtained a confirmatory radiograph with a pencil at the disk herniation with just caudad to L3-4.  A good restoration of thecal sac.  A neural probe passed freely up the foramen of L4 and L5 following decompression above the pedicle of L3.  There was no central decompression beneath the retained neural arch of L3.  We meticulously achieved hemostasis with thrombin-soaked Gelfoam, bipolar electrocautery and irrigation.  After confirmatory radiograph,we removed the McCullough retractor, irrigated the paraspinous musculature, and achieved strict hemostasis.  Thrombin- soaked Gelfoam had been placed in the laminotomy defect.  I then repaired the dorsolumbar fascia with #1 Vicryl with interrupted figure- of-eight sutures, subcu with 2, copious irrigation throughout and the skin with staples.  Wound was dressed sterilely.  Placed supine on the hospital bed, extubated without difficulty, and transported to the recovery room in satisfactory condition.  The patient tolerated the procedure well.  No complications.  ESTIMATED BLOOD LOSS:  150 mL.  The disk material sent to Pathology from L3-4.     Jene EveryJeffrey Tel Hevia, M.D.   ______________________________ Jene EveryJeffrey Delmus Warwick, M.D.    Cordelia PenJB/MEDQ  D:  10/02/2016  T:  10/02/2016  Job:  [id number]

## 2016-10-03 NOTE — Progress Notes (Signed)
Subjective: 1 Day Post-Op Procedure(s) (LRB): Microlumbar Decompression L4-5, L3-4, and Discectomy L3-4 (Bilateral) Patient reports pain as mild.  Reports leg pain almost entirely resolved. Still slight pain in the left lower leg but much improved. Hard to tell if any difference in the neuropathy in his feet. Reports incisional back pain which is well controlled. Voiding without difficulty. Feels ready to go home today. Tolerated PT very well yesterday.  Objective: Vital signs in last 24 hours: Temp:  [97.6 F (36.4 C)-99.1 F (37.3 C)] 98.5 F (36.9 C) (03/09 0607) Pulse Rate:  [87-104] 100 (03/09 0607) Resp:  [12-19] 16 (03/09 0607) BP: (116-155)/(72-99) 128/82 (03/09 0607) SpO2:  [95 %-100 %] 98 % (03/09 0607) Weight:  [85.7 kg (189 lb)] 85.7 kg (189 lb) (03/08 0834)  Intake/Output from previous day: 03/08 0701 - 03/09 0700 In: 5063.3 [P.O.:1560; I.V.:3248.3; IV Piggyback:255] Out: 5315 [Urine:5215; Blood:100] Intake/Output this shift: No intake/output data recorded.  No results for input(s): HGB in the last 72 hours. No results for input(s): WBC, RBC, HCT, PLT in the last 72 hours. No results for input(s): NA, K, CL, CO2, BUN, CREATININE, GLUCOSE, CALCIUM in the last 72 hours. No results for input(s): LABPT, INR in the last 72 hours.  Neurologically intact ABD soft Neurovascular intact Sensation intact distally Intact pulses distally Dorsiflexion/Plantar flexion intact Incision: dressing C/D/I and no drainage No cellulitis present Compartment soft no calf pain or sign of DVT  Assessment/Plan: 1 Day Post-Op Procedure(s) (LRB): Microlumbar Decompression L4-5, L3-4, and Discectomy L3-4 (Bilateral) Advance diet Up with therapy D/C IV fluids  Discussed D/C instructions and dressing instructions Remove surgical dressing and place aquacel prior to D/C D/C home today after morning PT Follow up 14 days post-op for staple removal Will discuss with Dr. Elissa LovettBeane  BISSELL,  Dayna BarkerJACLYN M. 10/03/2016, 8:05 AM

## 2016-10-17 NOTE — Op Note (Signed)
NAME:  [name], [name]                    ACCOUNT NO.:  MEDICAL RECORD NO.:  [id number]  LOCATION:                                 FACILITY:  PHYSICIAN:  Jene EveryJeffrey Collen Hostler, M.D.         DATE OF BIRTH:  DATE OF PROCEDURE:  10/16/2016 DATE OF DISCHARGE:                              OPERATIVE REPORT   ADDENDUM:  Under procedure performed, instead of Gill laminectomy of #2, replace that with central laminectomy of L4.     Jene EveryJeffrey Tyre Beaver, M.D.     Cordelia PenJB/MEDQ  D:  10/16/2016  T:  10/16/2016  Job:  [id number]

## 2018-08-15 IMAGING — DX DG LUMBAR SPINE 2-3V
2 series · 2 of 2 positions shown · non-contrast
Comparison: None.

CLINICAL DATA: Preop HNP

EXAM:
LUMBAR SPINE - 2-3 VIEW

[l-spine lat]
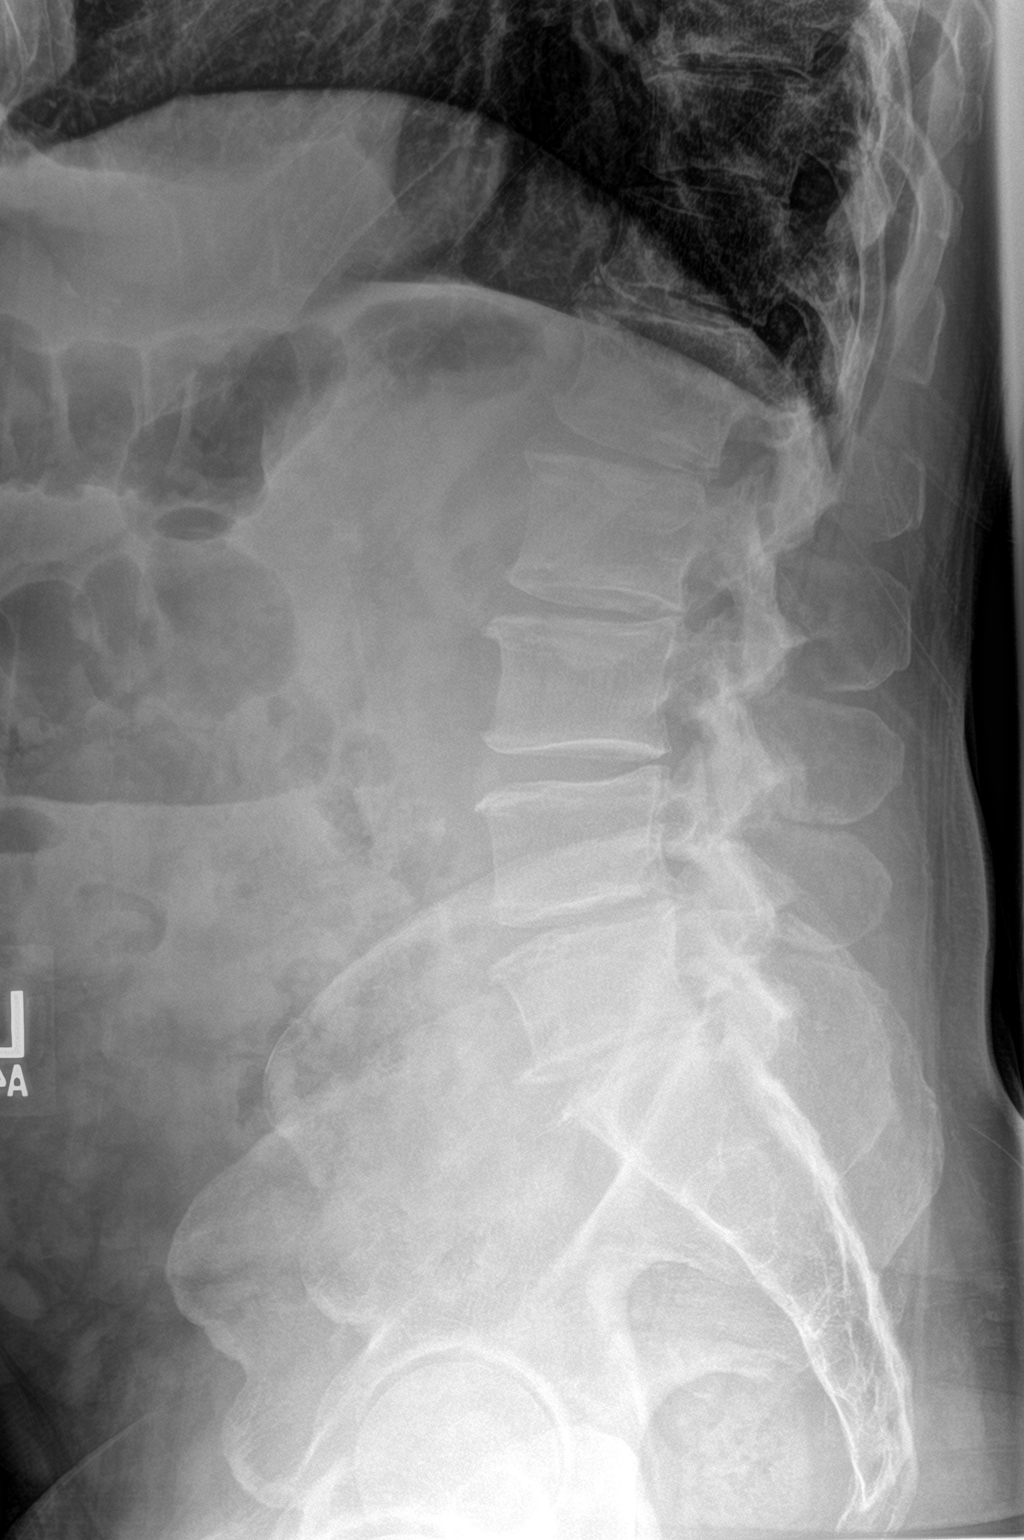

[l-spine ap]
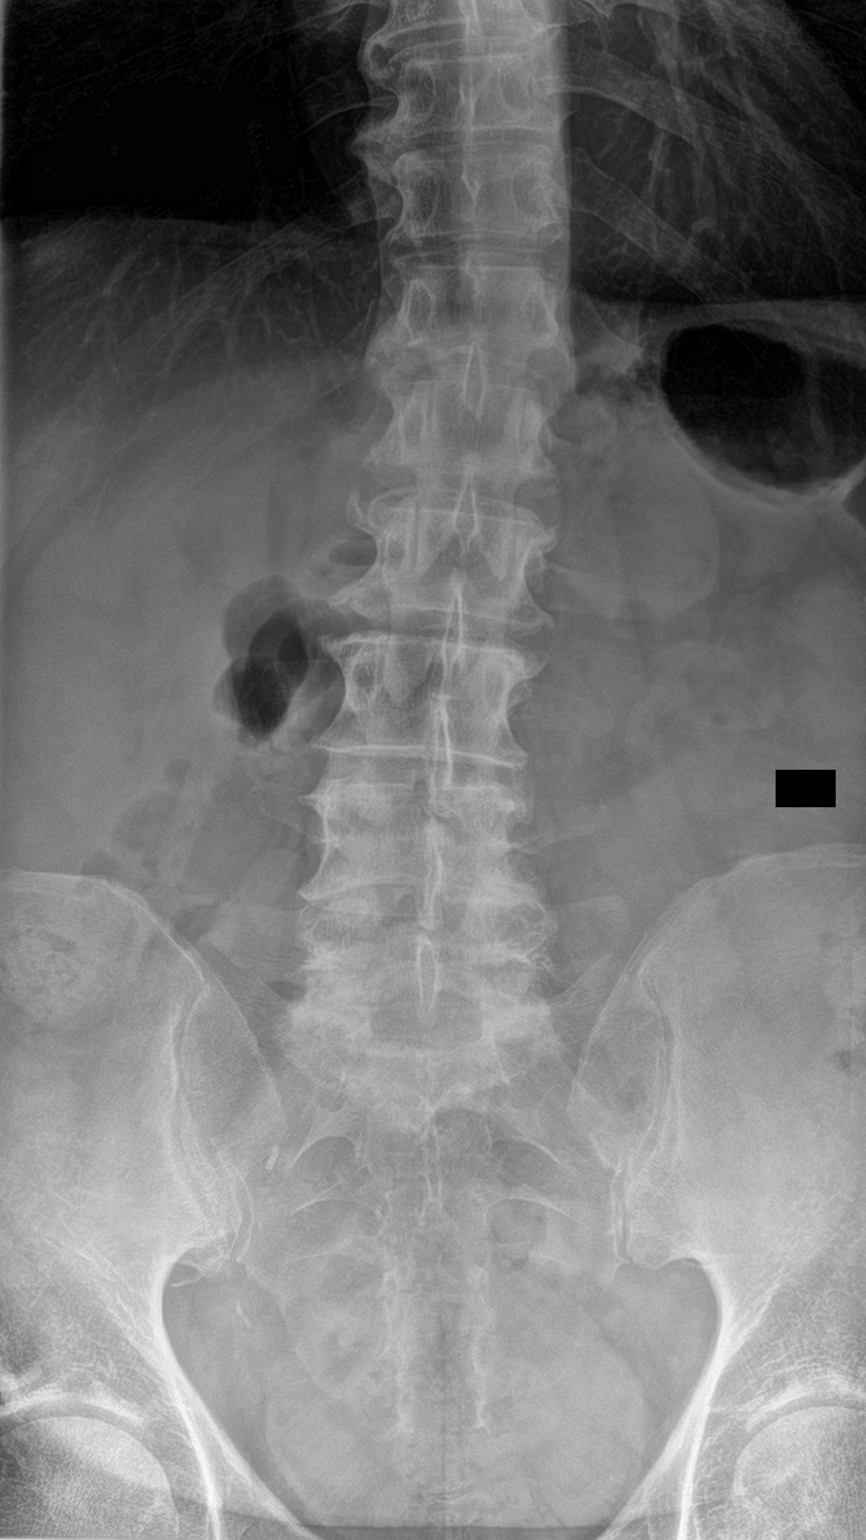

[2 of 2 positions shown; findings below may reference images not displayed]

FINDINGS: Mild lumbar scoliosis. Normal alignment. Negative for fracture or
mass.

Mild disc space narrowing and spurring at L3-4 and L4-5 and L5-S1.
Lowest disc space L5-S1.
IMPRESSION: Scoliosis and mild lumbar degenerative change. Lumbar disc spaces
are numbered for preoperative planning purposes.
